# Patient Record
Sex: Female | Born: 1980 | Race: Black or African American | Hispanic: No | Marital: Single | Smoking: Never smoker
Health system: Southern US, Community
[De-identification: ages and names within clinical notes are randomized; demographics above are authoritative.]

## PROBLEM LIST (undated history)

## (undated) DIAGNOSIS — Z8041 Family history of malignant neoplasm of ovary: Secondary | ICD-10-CM

## (undated) DIAGNOSIS — Z801 Family history of malignant neoplasm of trachea, bronchus and lung: Secondary | ICD-10-CM

## (undated) DIAGNOSIS — Z8042 Family history of malignant neoplasm of prostate: Secondary | ICD-10-CM

## (undated) DIAGNOSIS — Z803 Family history of malignant neoplasm of breast: Secondary | ICD-10-CM

## (undated) HISTORY — PX: WISDOM TOOTH EXTRACTION: SHX21

## (undated) HISTORY — PX: LYMPH GLAND EXCISION: SHX13

## (undated) HISTORY — DX: Family history of malignant neoplasm of breast: Z80.3

## (undated) HISTORY — DX: Family history of malignant neoplasm of prostate: Z80.42

## (undated) HISTORY — DX: Family history of malignant neoplasm of ovary: Z80.41

## (undated) HISTORY — DX: Family history of malignant neoplasm of trachea, bronchus and lung: Z80.1

---
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
## 2000-10-09 ENCOUNTER — Other Ambulatory Visit: Admission: RE | Admit: 2000-10-09 | Discharge: 2000-10-09 | Payer: Self-pay | Admitting: Obstetrics and Gynecology

## 2000-12-11 ENCOUNTER — Encounter (HOSPITAL_BASED_OUTPATIENT_CLINIC_OR_DEPARTMENT_OTHER): Payer: Self-pay | Admitting: General Surgery

## 2000-12-13 ENCOUNTER — Ambulatory Visit (HOSPITAL_COMMUNITY): Admission: RE | Admit: 2000-12-13 | Discharge: 2000-12-13 | Payer: Self-pay | Admitting: General Surgery

## 2000-12-13 ENCOUNTER — Encounter (INDEPENDENT_AMBULATORY_CARE_PROVIDER_SITE_OTHER): Payer: Self-pay | Admitting: Specialist

## 2001-09-30 ENCOUNTER — Other Ambulatory Visit: Admission: RE | Admit: 2001-09-30 | Discharge: 2001-09-30 | Payer: Self-pay | Admitting: Obstetrics and Gynecology

## 2001-12-23 ENCOUNTER — Other Ambulatory Visit: Admission: RE | Admit: 2001-12-23 | Discharge: 2001-12-23 | Payer: Self-pay | Admitting: Obstetrics and Gynecology

## 2002-04-09 ENCOUNTER — Other Ambulatory Visit: Admission: RE | Admit: 2002-04-09 | Discharge: 2002-04-09 | Payer: Self-pay | Admitting: Obstetrics and Gynecology

## 2002-09-03 ENCOUNTER — Other Ambulatory Visit: Admission: RE | Admit: 2002-09-03 | Discharge: 2002-09-03 | Payer: Self-pay | Admitting: *Deleted

## 2003-02-22 ENCOUNTER — Other Ambulatory Visit: Admission: RE | Admit: 2003-02-22 | Discharge: 2003-02-22 | Payer: Self-pay | Admitting: Obstetrics and Gynecology

## 2003-08-18 ENCOUNTER — Other Ambulatory Visit: Admission: RE | Admit: 2003-08-18 | Discharge: 2003-08-18 | Payer: Self-pay | Admitting: Obstetrics and Gynecology

## 2004-05-03 ENCOUNTER — Other Ambulatory Visit: Admission: RE | Admit: 2004-05-03 | Discharge: 2004-05-03 | Payer: Self-pay | Admitting: Obstetrics and Gynecology

## 2004-08-23 ENCOUNTER — Other Ambulatory Visit: Admission: RE | Admit: 2004-08-23 | Discharge: 2004-08-23 | Payer: Self-pay | Admitting: Obstetrics and Gynecology

## 2005-03-01 ENCOUNTER — Other Ambulatory Visit: Admission: RE | Admit: 2005-03-01 | Discharge: 2005-03-01 | Payer: Self-pay | Admitting: Obstetrics and Gynecology

## 2005-09-07 ENCOUNTER — Other Ambulatory Visit: Admission: RE | Admit: 2005-09-07 | Discharge: 2005-09-07 | Payer: Self-pay | Admitting: Obstetrics and Gynecology

## 2006-01-15 ENCOUNTER — Other Ambulatory Visit: Admission: RE | Admit: 2006-01-15 | Discharge: 2006-01-15 | Payer: Self-pay | Admitting: Obstetrics and Gynecology

## 2008-07-07 ENCOUNTER — Emergency Department (HOSPITAL_COMMUNITY): Admission: EM | Admit: 2008-07-07 | Discharge: 2008-07-07 | Payer: Self-pay | Admitting: Emergency Medicine

## 2008-11-23 ENCOUNTER — Inpatient Hospital Stay (HOSPITAL_COMMUNITY): Admission: AD | Admit: 2008-11-23 | Discharge: 2008-11-23 | Payer: Self-pay | Admitting: Obstetrics and Gynecology

## 2009-03-02 ENCOUNTER — Observation Stay (HOSPITAL_COMMUNITY): Admission: AD | Admit: 2009-03-02 | Discharge: 2009-03-03 | Payer: Self-pay | Admitting: Obstetrics and Gynecology

## 2009-03-27 ENCOUNTER — Ambulatory Visit (HOSPITAL_COMMUNITY): Admission: AD | Admit: 2009-03-27 | Discharge: 2009-03-27 | Payer: Self-pay | Admitting: Obstetrics and Gynecology

## 2009-05-10 ENCOUNTER — Emergency Department (HOSPITAL_COMMUNITY): Admission: EM | Admit: 2009-05-10 | Discharge: 2009-05-10 | Payer: Self-pay | Admitting: Family Medicine

## 2009-06-03 ENCOUNTER — Inpatient Hospital Stay (HOSPITAL_COMMUNITY): Admission: RE | Admit: 2009-06-03 | Discharge: 2009-06-05 | Payer: Self-pay | Admitting: Obstetrics and Gynecology

## 2009-07-26 ENCOUNTER — Emergency Department (HOSPITAL_COMMUNITY): Admission: EM | Admit: 2009-07-26 | Discharge: 2009-07-26 | Payer: Self-pay | Admitting: Emergency Medicine

## 2009-12-01 ENCOUNTER — Emergency Department (HOSPITAL_COMMUNITY): Admission: EM | Admit: 2009-12-01 | Discharge: 2009-12-02 | Payer: Self-pay | Admitting: Emergency Medicine

## 2010-02-03 ENCOUNTER — Emergency Department (HOSPITAL_COMMUNITY): Admission: EM | Admit: 2010-02-03 | Discharge: 2010-02-03 | Payer: Self-pay | Admitting: Family Medicine

## 2010-09-11 ENCOUNTER — Inpatient Hospital Stay (INDEPENDENT_AMBULATORY_CARE_PROVIDER_SITE_OTHER)
Admission: RE | Admit: 2010-09-11 | Discharge: 2010-09-11 | Disposition: A | Discharge status: Home / Self Care | Payer: Self-pay | Source: Ambulatory Visit | Attending: Family Medicine | Admitting: Family Medicine

## 2010-09-11 DIAGNOSIS — S8000XA Contusion of unspecified knee, initial encounter: Secondary | ICD-10-CM

## 2010-10-24 LAB — COMPREHENSIVE METABOLIC PANEL
ALT: 12 U/L (ref 0–35)
AST: 15 U/L (ref 0–37)
Albumin: 4 g/dL (ref 3.5–5.2)
Calcium: 9.4 mg/dL (ref 8.4–10.5)
GFR calc Af Amer: >60 mL/min (ref >60)
Sodium: 139 mEq/L (ref 135–145)
Total Protein: 7.6 g/dL (ref 6.0–8.3)

## 2010-10-24 LAB — RAPID URINE DRUG SCREEN, HOSP PERFORMED
Benzodiazepines: NOT DETECTED
Cocaine: NOT DETECTED
Tetrahydrocannabinol: NOT DETECTED

## 2010-10-24 LAB — DIFFERENTIAL
Eosinophils Absolute: 0.1 10*3/uL (ref 0.0–0.7)
Eosinophils Relative: 1 % (ref 0–5)
Lymphs Abs: 1.6 10*3/uL (ref 0.7–4.0)
Monocytes Absolute: 0.7 10*3/uL (ref 0.1–1.0)
Monocytes Relative: 7 % (ref 3–12)

## 2010-10-24 LAB — CBC
MCHC: 33.3 g/dL (ref 30.0–36.0)
RDW: 13 % (ref 11.5–15.5)

## 2010-10-24 LAB — URINALYSIS, ROUTINE W REFLEX MICROSCOPIC
Bilirubin Urine: NEGATIVE
Ketones, ur: NEGATIVE mg/dL
Nitrite: NEGATIVE
Protein, ur: NEGATIVE mg/dL
pH: 7.5 (ref 5.0–8.0)

## 2010-10-24 LAB — POCT PREGNANCY, URINE: Preg Test, Ur: NEGATIVE

## 2010-10-24 LAB — ETHANOL: Alcohol, Ethyl (B): <5 mg/dL (ref 0–10)

## 2010-11-02 ENCOUNTER — Inpatient Hospital Stay (INDEPENDENT_AMBULATORY_CARE_PROVIDER_SITE_OTHER)
Admission: RE | Admit: 2010-11-02 | Discharge: 2010-11-02 | Disposition: A | Discharge status: Home / Self Care | Payer: Self-pay | Source: Ambulatory Visit | Attending: Emergency Medicine | Admitting: Emergency Medicine

## 2010-11-02 DIAGNOSIS — N390 Urinary tract infection, site not specified: Secondary | ICD-10-CM

## 2010-11-02 DIAGNOSIS — J029 Acute pharyngitis, unspecified: Secondary | ICD-10-CM

## 2010-11-02 LAB — POCT URINALYSIS DIP (DEVICE)
Hgb urine dipstick: NEGATIVE
Nitrite: POSITIVE — AB
Urobilinogen, UA: 1 mg/dL (ref 0.0–1.0)
pH: 5.5 (ref 5.0–8.0)

## 2010-11-09 LAB — CBC
HCT: 34.8 % — ABNORMAL LOW (ref 36.0–46.0)
Hemoglobin: 10.1 g/dL — ABNORMAL LOW (ref 12.0–15.0)
MCHC: 33.5 g/dL (ref 30.0–36.0)
MCV: 86.6 fL (ref 78.0–100.0)
Platelets: 111 10*3/uL — ABNORMAL LOW (ref 150–400)
RDW: 14.3 % (ref 11.5–15.5)
WBC: 15.4 10*3/uL — ABNORMAL HIGH (ref 4.0–10.5)

## 2010-11-09 LAB — RH IMMUNE GLOB WKUP(>/=20WKS)(NOT WOMEN'S HOSP): Fetal Screen: NEGATIVE

## 2010-11-09 LAB — GLUCOSE, CAPILLARY: Glucose-Capillary: 64 mg/dL — ABNORMAL LOW (ref 70–99)

## 2010-11-11 LAB — CBC
HCT: 32.5 % — ABNORMAL LOW (ref 36.0–46.0)
MCHC: 33.7 g/dL (ref 30.0–36.0)
MCV: 90.9 fL (ref 78.0–100.0)
Platelets: 126 10*3/uL — ABNORMAL LOW (ref 150–400)
RDW: 12.9 % (ref 11.5–15.5)

## 2010-11-11 LAB — RH IMMUNE GLOBULIN WORKUP (NOT WOMEN'S HOSP)
ABO/RH(D): B NEG
Antibody Screen: NEGATIVE

## 2010-11-12 LAB — COMPREHENSIVE METABOLIC PANEL
ALT: 13 U/L (ref 0–35)
Albumin: 2.6 g/dL — ABNORMAL LOW (ref 3.5–5.2)
Alkaline Phosphatase: 50 U/L (ref 39–117)
BUN: 4 mg/dL — ABNORMAL LOW (ref 6–23)
Chloride: 104 mEq/L (ref 96–112)
Glucose, Bld: 93 mg/dL (ref 70–99)
Potassium: 4.2 mEq/L (ref 3.5–5.1)
Sodium: 134 mEq/L — ABNORMAL LOW (ref 135–145)
Total Bilirubin: 0.6 mg/dL (ref 0.3–1.2)

## 2010-11-12 LAB — CBC
HCT: 30.2 % — ABNORMAL LOW (ref 36.0–46.0)
Hemoglobin: 10.4 g/dL — ABNORMAL LOW (ref 12.0–15.0)
Platelets: 113 10*3/uL — ABNORMAL LOW (ref 150–400)
Platelets: 122 10*3/uL — ABNORMAL LOW (ref 150–400)
RDW: 12.8 % (ref 11.5–15.5)
WBC: 12.6 10*3/uL — ABNORMAL HIGH (ref 4.0–10.5)

## 2010-11-12 LAB — RH IMMUNE GLOBULIN WORKUP (NOT WOMEN'S HOSP): ABO/RH(D): B NEG

## 2010-11-12 LAB — KLEIHAUER-BETKE STAIN: Quantitation Fetal Hemoglobin: 0 mL

## 2010-11-12 LAB — URIC ACID: Uric Acid, Serum: 4.3 mg/dL (ref 2.4–7.0)

## 2010-11-15 LAB — RH IMMUNE GLOBULIN WORKUP (NOT WOMEN'S HOSP)
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
## 2010-12-22 NOTE — Op Note (Signed)
[place]. [facility]  Patient:    Tiffany Singleton, DEAKINS                        MRN: [id number] Proc. Date: 12/13/00 Attending:  Luisa Hart L. Lurene Shadow, M.D.                           Operative Report  PREOPERATIVE DIAGNOSIS:  Right-sided axillary ectopic breast tissue.  POSTOPERATIVE DIAGNOSIS:  Right-sided axillary ectopic breast tissue.  OPERATION PERFORMED:  Excision of right axillary mass (ectopic breast tissue).  SURGEON:  Mardene Celeste. Lurene Shadow, M.D.  ASSISTANT:  Nurse.  ANESTHESIA:  General.  INDICATIONS FOR PROCEDURE:  The patient is a 30 year old female with an enlarging mass in the right axilla which causes some pain, particularly during the course of menses and she is brought to the operating room now for excision of this mass.  DESCRIPTION OF PROCEDURE:  Following the induction of anesthesia, the patient was positioned supinely and the right axilla prepped and draped to be included in a sterile operative field.  I palpated the mass, infiltrated around it with 1% Xylocaine with epinephrine.  I made a transverse axillary incision and deepened this through the skin and subcutaneous tissues raising flaps inferiorly and superiorly.  The mass was dissected free from the underlying skin carrying it down to the chest wall.  The entire mass was removed in its entirety and forwarded for pathologic evaluation.  Hemostasis was assured with electrocautery.  Sponge, instrument and sharp counts were verified. Subcutaneous tissues closed with interrupted 3-0 Vicryl sutures.  Skin closed with 5-0 Monocryl and reinforced with Steri-Strips.  Sterile dressings applied.  Anesthetic reversed.  Patient removed from the operating room to the recovery room in stable condition having tolerated the procedure well. DD:  12/13/00 TD:  12/13/00 Job: [id number]

## 2011-05-15 ENCOUNTER — Emergency Department (HOSPITAL_BASED_OUTPATIENT_CLINIC_OR_DEPARTMENT_OTHER)
Admission: EM | Admit: 2011-05-15 | Discharge: 2011-05-15 | Disposition: A | Discharge status: Home / Self Care | Payer: Self-pay | Attending: Emergency Medicine | Admitting: Emergency Medicine

## 2011-05-15 ENCOUNTER — Encounter: Payer: Self-pay | Admitting: Family Medicine

## 2011-05-15 DIAGNOSIS — H9209 Otalgia, unspecified ear: Secondary | ICD-10-CM | POA: Insufficient documentation

## 2011-05-15 DIAGNOSIS — H6092 Unspecified otitis externa, left ear: Secondary | ICD-10-CM

## 2011-05-15 DIAGNOSIS — H60399 Other infective otitis externa, unspecified ear: Secondary | ICD-10-CM | POA: Insufficient documentation

## 2011-05-15 LAB — RAPID STREP SCREEN (MED CTR MEBANE ONLY): Streptococcus, Group A Screen (Direct): NEGATIVE

## 2011-05-15 MED ORDER — NEOMYCIN-POLYMYXIN-HC 3.5-10000-1 OT SUSP: 4.0000 [drp] | Freq: Three times a day (TID) | OTIC | Status: AC

## 2011-05-15 NOTE — ED Notes (Signed)
MD at bedside. 

## 2011-05-15 NOTE — ED Provider Notes (Addendum)
History     CSN: [id number] Arrival date & time: 05/15/2011  8:35 AM  Chief Complaint  Patient presents with  . Otalgia    (Consider location/radiation/quality/duration/timing/severity/associated sxs/prior treatment) Patient is a 30 y.o. female presenting with ear pain. The history is provided by the patient.  Otalgia This is a new problem. The current episode started more than 2 days ago. There is pain in the left ear. The problem occurs constantly. The problem has not changed since onset.There has been no fever. The pain is mild. Associated symptoms include ear discharge, rhinorrhea, sore throat, cough and rash. Pertinent negatives include no headaches, no hearing loss, no diarrhea, no vomiting and no neck pain. Her past medical history does not include chronic ear infection or hearing loss.   Patient states she tried using debrox solution and hydrogen peroxide in her ear. This seemed to exacerbate her symptoms. History reviewed. No pertinent past medical history.  Past Surgical History  Procedure Date  . Lymph gland excision   . Wisdom tooth extraction     No family history on file.  History  Substance Use Topics  . Smoking status: Never Smoker   . Smokeless tobacco: Not on file  . Alcohol Use: No    OB History    Grav Para Term Preterm Abortions TAB SAB Ect Mult Living                  Review of Systems  HENT: Positive for ear pain, sore throat, rhinorrhea and ear discharge. Negative for hearing loss and neck pain.   Respiratory: Positive for cough.   Gastrointestinal: Negative for vomiting and diarrhea.  Skin: Positive for rash.  Neurological: Negative for headaches.  All other systems reviewed and are negative.    Allergies  Hydrocodone  Home Medications   Current Outpatient Rx  Name Route Sig Dispense Refill  . ACETAMINOPHEN 325 MG PO TABS Oral Take 650 mg by mouth every 6 (six) hours as needed.        BP 117/68  Pulse 63  Temp(Src) 98.5 F (36.9  C) (Oral)  Resp 16  Ht 5\' 2"  (1.575 m)  Wt 155 lb (70.308 kg)  BMI 28.35 kg/m2  SpO2 100%  Physical Exam  Nursing note and vitals reviewed. Constitutional: She appears well-developed and well-nourished. No distress.  HENT:  Head: Normocephalic and atraumatic.  Right Ear: External ear normal.  Left Ear: External ear normal.  Mouth/Throat: Oropharynx is clear and moist. No oropharyngeal exudate.       Tenderness to palpation with manipulation of left external ear, small amount yellowish crusting debris within the canal, no significant erythema or purulent drainage  Eyes: Conjunctivae are normal. Right eye exhibits no discharge. Left eye exhibits no discharge. No scleral icterus.  Neck: Neck supple. No tracheal deviation present.  Cardiovascular: Normal rate and normal heart sounds.   Pulmonary/Chest: Effort normal. No stridor. No respiratory distress. She has no wheezes. She has no rales.  Musculoskeletal: She exhibits no edema.  Neurological: She is alert. Cranial nerve deficit: no gross deficits.  Skin: Skin is warm and dry. No rash noted.  Psychiatric: She has a normal mood and affect.    ED Course  Procedures (including critical care time)   Labs Reviewed  RAPID STREP SCREEN  negative No results found.   1. Otitis externa of left ear       MDM  Patient with very mild symptoms and findings to suggest a external otitis. Negative strep screen in  the emergency department.  Patient will be given a prescription for Cortisporin ear drops.  She is counseled to discontinue straight hydrogen peroxide use in her ears I think that may be contributing to some of her irritation.       Celene Kras, MD 05/15/11 1610  Celene Kras, MD 05/15/11 [phone]

## 2011-05-15 NOTE — ED Notes (Signed)
Pt c/o bilateral ear pain and sore throat. Pt sts she used OTC "ear wax removal" and "it started burning so I was concerned".

## 2012-05-08 ENCOUNTER — Encounter: Payer: Self-pay | Admitting: Obstetrics and Gynecology

## 2016-07-11 ENCOUNTER — Ambulatory Visit (INDEPENDENT_AMBULATORY_CARE_PROVIDER_SITE_OTHER): Payer: Self-pay | Admitting: Family Medicine

## 2016-07-11 ENCOUNTER — Telehealth: Payer: Self-pay | Admitting: Family Medicine

## 2016-07-11 VITALS — BP 112/74 | HR 69 | Temp 98.7°F | Resp 17 | Ht 62.0 in | Wt 138.0 lb

## 2016-07-11 DIAGNOSIS — Z113 Encounter for screening for infections with a predominantly sexual mode of transmission: Secondary | ICD-10-CM

## 2016-07-11 DIAGNOSIS — N760 Acute vaginitis: Secondary | ICD-10-CM

## 2016-07-11 DIAGNOSIS — R350 Frequency of micturition: Secondary | ICD-10-CM

## 2016-07-11 LAB — POCT WET + KOH PREP
Trich by wet prep: ABSENT
YEAST BY KOH: ABSENT
YEAST BY WET PREP: ABSENT

## 2016-07-11 LAB — POCT URINALYSIS DIP (MANUAL ENTRY)
Bilirubin, UA: NEGATIVE
Blood, UA: NEGATIVE
Glucose, UA: NEGATIVE
Ketones, POC UA: NEGATIVE
LEUKOCYTES UA: NEGATIVE
Nitrite, UA: NEGATIVE
PH UA: 6.5
PROTEIN UA: NEGATIVE
Spec Grav, UA: 1.015
UROBILINOGEN UA: 0.2

## 2016-07-11 LAB — POC MICROSCOPIC URINALYSIS (UMFC): MUCUS RE: ABSENT

## 2016-07-11 NOTE — Progress Notes (Signed)
Tiffany Singleton is a 35 y.o. female who presents to [facility] today with complaints concerning for UTI:   1.  Concern for UTI: Tiffany Sicklesngel C Pala is a 35 y.o. female who complains of urinary frequency, urgency and dysuria x several days.  Patient states she's having alternating BV with UTI symptoms for the past month.  Started with BV indicated by odor.  PCP tested her for the same and was positive.  Began having urinary frequency and some dysuria about 2 weeks later.  Took Azo and after several days symptoms improved.  About 1-2 weeks later her BV symptoms returned.  PCP called in Flagyl for her and this resolved.    However, for past several days she's had persistent vaginal odor.  As well as ongoing urinary frequency and dysuria and hesitancy.  Has taken Azo on and off with inconsistent resolution of symptoms.    New sexual partner for past several months.  No abdominal pain, no history of STDs.  Would like to be tested today.   She denies any flank or back pain, fever, chills, vomiting, or abnormal vaginal discharge/itching/bleeding.  Does endorse some suprapubic pressure.  No history of incontinence.    The following portions of the patient's history were reviewed and updated as appropriate: allergies, current medications, past medical history, family and social history, and problem list.  Patient is a nonsmoker.    No past medical history on file. Past Surgical History:  Procedure Laterality Date  . LYMPH GLAND EXCISION    . WISDOM TOOTH EXTRACTION      Medications reviewed. Current Outpatient Prescriptions  Medication Sig Dispense Refill  . acetaminophen (TYLENOL) 325 MG tablet Take 650 mg by mouth every 6 (six) hours as needed.       No current facility-administered medications for this visit.     ROS as above otherwise neg.       Objective:   Physical Exam BP 112/74 (BP Location: Left Arm, Patient Position: Sitting, Cuff Size: Large)   Pulse 69   Temp 98.7 F (37.1 C) (Oral)    Resp 17   Ht 5\' 2"  (1.575 m)   Wt 138 lb (62.6 kg)   SpO2 100%   BMI 25.24 kg/m  Gen:  Alert, cooperative patient who appears stated age in no acute distress.  Vital signs reviewed. Mouth: MMM Cardiac:  Regular rate and rhythm without murmur auscultated.  Good S1/S2. Pulm:  Clear to auscultation bilaterally with good air movement.  No wheezes or rales noted.   Abdomen:  Soft/ND/NT.  No CVA tenderness bilaterally  GYN:  External genitalia within normal limits.  Vaginal mucosa pink, moist, normal rugae.  Nonfriable cervix without lesions, thick white discharge without bleeding noted on speculum exam.     Results for orders placed or performed in visit on 07/11/16  POCT Wet + KOH Prep  Result Value Ref Range   Yeast by KOH Absent Absent   Yeast by wet prep Absent Absent   WBC by wet prep Moderate (A) Few   Clue Cells Wet Prep HPF POC Few (A) None   Trich by wet prep Absent Absent   Bacteria Wet Prep HPF POC Moderate (A) Few   Epithelial Cells By [employer]et Pref ([facility]) Moderate (A) None, Few, Too numerous to count   RBC,UR,HPF,POC None None RBC/hpf  POCT urinalysis dipstick  Result Value Ref Range   Color, UA yellow yellow   Clarity, UA cloudy (A) clear   Glucose, UA negative negative  Bilirubin, UA negative negative   Ketones, POC UA negative negative   Spec Grav, UA 1.015    Blood, UA negative negative   pH, UA 6.5    Protein Ur, POC negative negative   Urobilinogen, UA 0.2    Nitrite, UA Negative Negative   Leukocytes, UA Negative Negative  POCT Microscopic Urinalysis ([facility])  Result Value Ref Range   WBC,UR,HPF,POC Moderate (A) None WBC/hpf   RBC,UR,HPF,POC None None RBC/hpf   Bacteria Moderate (A) None, Too numerous to count   Mucus Absent Absent   Epithelial Cells, UR Per Microscopy Moderate (A) None, Too numerous to count cells/hpf     Imp/Plan: 1.  UTI: Treatment with Keflex x 7 days. Treat based on symptoms and U/A. Call or return to clinic prn if these symptoms  worsen or fail to improve as anticipated, or if she begins to have any back pain, fevers, or chills.  2.  BV: - Patient waited about 30 minutes and still didn't have test results.   - I told her I would call -- however, I was unable to reach her when I called. - Will treat with Flagyl x 7 days.   - We do NOT have a pharmacy for her.  - Will forward to clinical team to call patient and let know results and see where we can send her scripts.

## 2016-07-11 NOTE — Patient Instructions (Addendum)
  It was good to meet you today.    I'll be giving you a call with your results in a just a little bit.   The blood tests will take about 24 hours to come back.   IF you received an x-ray today, you will receive an invoice from [facility]. Please contact [facility] at [phone] with questions or concerns regarding your invoice.   IF you received labwork today, you will receive an invoice from [facility]. Please contact [facility] at [phone] with questions or concerns regarding your invoice.   Our billing staff will not be able to assist you with questions regarding bills from these companies.  You will be contacted with the lab results as soon as they are available. The fastest way to get your results is to activate your My Chart account. Instructions are located on the last page of this paperwork. If you have not heard from us regarding the results in 2 weeks, please contact this office.

## 2016-07-11 NOTE — Telephone Encounter (Signed)
Tried to call patient to ask for pharmacy (none on file) and to relay test results.  However, she didn't answer.  Left very vague message and asked her to return call.    Please call patient and let her know she has BV and UTI.  We can send in keflex and flagyl to treat once we know her pharmacy.

## 2016-07-12 LAB — RPR: RPR Ser Ql: NONREACTIVE

## 2016-07-12 LAB — GC/CHLAMYDIA PROBE AMP
Chlamydia trachomatis, NAA: NEGATIVE
NEISSERIA GONORRHOEAE BY PCR: NEGATIVE

## 2016-07-12 LAB — HIV ANTIBODY (ROUTINE TESTING W REFLEX): HIV SCREEN 4TH GENERATION: NONREACTIVE

## 2016-07-12 NOTE — Telephone Encounter (Signed)
Pt calling stating thaT dR WALDEN CALLED HER ABOUT LABS WHICH I GAVE HER RESULTS AND PT WANTS US TO SEND RX TO [facility] [address] PLEASE RESPOND

## 2016-07-12 NOTE — Telephone Encounter (Signed)
Patient is calling because the prescriptions for keflex and flagyl were not sent to the pharmacy. Patient was told that she would receive them today by 2 pm. Please advise!   [facility] [address]

## 2016-07-13 MED ORDER — CEPHALEXIN 500 MG PO CAPS: 500.0000 mg | ORAL_CAPSULE | Freq: Three times a day (TID) | ORAL | 0 refills | Status: DC

## 2016-07-13 MED ORDER — METRONIDAZOLE 500 MG PO TABS: 500.0000 mg | ORAL_TABLET | Freq: Two times a day (BID) | ORAL | 0 refills | Status: DC

## 2016-07-13 NOTE — Telephone Encounter (Signed)
Notified pt on VM that both Rxs were sent and advised to not drink any alcohol with Flagyl.

## 2016-07-13 NOTE — Telephone Encounter (Signed)
I will forward this to Dr Gwendolyn GrantWalden in case he is monitoring his messages while out of the office, but also send to pool for "[employer]" for someone else to address if not since he will not be back in office for several days.  Copied following from Dr Tyson AliasWalden's Plan in 12/6 OV notes:   Imp/Plan: 1.  UTI: Treatment with Keflex x 7 days. Treat based on symptoms and U/A. Call or return to clinic prn if these symptoms worsen or fail to improve as anticipated, or if she begins to have any back pain, fevers, or chills.  2.  BV: - Patient waited about 30 minutes and still didn't have test results.   - I told her I would call -- however, I was unable to reach her when I called. - Will treat with Flagyl x 7 days.   - We do NOT have a pharmacy for her.  - Will forward to clinical team to call patient and let know results and see where we can send her scripts.

## 2016-07-13 NOTE — Telephone Encounter (Signed)
Prescriptions for Keflex and Flagyl sent to [facility] [address]

## 2016-08-14 ENCOUNTER — Other Ambulatory Visit: Payer: Self-pay | Admitting: Family Medicine

## 2017-05-16 ENCOUNTER — Ambulatory Visit (INDEPENDENT_AMBULATORY_CARE_PROVIDER_SITE_OTHER): Payer: Self-pay | Admitting: Family Medicine

## 2017-05-16 ENCOUNTER — Encounter: Payer: Self-pay | Admitting: Family Medicine

## 2017-05-16 VITALS — BP 112/80 | HR 81 | Temp 98.6°F | Resp 18 | Ht 62.0 in | Wt 147.4 lb

## 2017-05-16 DIAGNOSIS — R300 Dysuria: Secondary | ICD-10-CM

## 2017-05-16 DIAGNOSIS — N3000 Acute cystitis without hematuria: Secondary | ICD-10-CM

## 2017-05-16 LAB — POCT URINALYSIS DIP (MANUAL ENTRY)
BILIRUBIN UA: NEGATIVE
GLUCOSE UA: NEGATIVE mg/dL
Ketones, POC UA: NEGATIVE mg/dL
NITRITE UA: POSITIVE — AB
PH UA: 6 (ref 5.0–8.0)
Protein Ur, POC: NEGATIVE mg/dL
RBC UA: NEGATIVE
Spec Grav, UA: 1.02 (ref 1.010–1.025)
Urobilinogen, UA: 0.2 E.U./dL

## 2017-05-16 LAB — POC MICROSCOPIC URINALYSIS (UMFC): Mucus: ABSENT

## 2017-05-16 MED ORDER — CEPHALEXIN 500 MG PO CAPS: 500.0000 mg | ORAL_CAPSULE | Freq: Three times a day (TID) | ORAL | 0 refills | Status: DC

## 2017-05-16 NOTE — Progress Notes (Signed)
Subjective:    Patient ID: Tiffany Singleton, female    DOB: [birth year], 36 y.o.   MRN: [id number] Chief Complaint  Patient presents with  . Back Pain    x1 week, lower left side, pt states she can go to the bathroom but has difficulty getting the urine to come out and patient reports a smell.    HPI   [name] is a delightful 36 yo woman who is here with back pain, urinary hesitancy, and a urinary odor.   History reviewed. No pertinent past medical history. Past Surgical History:  Procedure Laterality Date  . LYMPH GLAND EXCISION    . WISDOM TOOTH EXTRACTION     Current Outpatient Prescriptions on File Prior to Visit  Medication Sig Dispense Refill  . acetaminophen (TYLENOL) 325 MG tablet Take 650 mg by mouth every 6 (six) hours as needed.       No current facility-administered medications on file prior to visit.    Allergies  Allergen Reactions  . Hydrocodone    History reviewed. No pertinent family history. Social History   Social History  . Marital status: Single    Spouse name: N/A  . Number of children: N/A  . Years of education: N/A   Social History Main Topics  . Smoking status: Never Smoker  . Smokeless tobacco: Never Used  . Alcohol use No  . Drug use: No  . Sexual activity: Not Asked   Other Topics Concern  . None   Social History Narrative  . None   Depression screen [facility] 2/9 05/16/2017 07/11/2016  Decreased Interest 0 0  Down, Depressed, Hopeless 0 0  PHQ - 2 Score 0 0     Review of Systems See hpi    Objective:   Physical Exam  Constitutional: She is oriented to person, place, and time. She appears well-developed and well-nourished. No distress.  HENT:  Head: Normocephalic and atraumatic.  Cardiovascular: Normal rate, regular rhythm, normal heart sounds and intact distal pulses.   Pulmonary/Chest: Effort normal and breath sounds normal.  Abdominal: Soft. Bowel sounds are normal. She exhibits no distension. There is no  hepatosplenomegaly. There is no tenderness. There is CVA tenderness (Left>Rt). There is no rebound and no guarding.  Neurological: She is alert and oriented to person, place, and time.  Skin: Skin is warm and dry. She is not diaphoretic.  Psychiatric: She has a normal mood and affect. Her behavior is normal.      BP 112/80 (BP Location: Left Arm, Patient Position: Sitting, Cuff Size: Normal)   Pulse 81   Temp 98.6 F (37 C) (Oral)   Resp 18   Ht  (1.575 m)   Wt 147 lb 6.4 oz (66.9 kg)   SpO2 99%   BMI 26.96 kg/m   Results for orders placed or performed in visit on 05/16/17  POCT urinalysis dipstick  Result Value Ref Range   Color, UA yellow yellow   Clarity, UA clear clear   Glucose, UA negative negative mg/dL   Bilirubin, UA negative negative   Ketones, POC UA negative negative mg/dL   Spec Grav, UA 4.098 1.191 - 1.025   Blood, UA negative negative   pH, UA 6.0 5.0 - 8.0   Protein Ur, POC negative negative mg/dL   Urobilinogen, UA 0.2 0.2 or 1.0 E.U./dL   Nitrite, UA Positive (A) Negative   Leukocytes, UA Large (3+) (A) Negative  POCT Microscopic Urinalysis ([facility])  Result Value Ref Range  WBC,UR,HPF,POC Too numerous to count  (A) None WBC/hpf   RBC,UR,HPF,POC None None RBC/hpf   Bacteria Too numerous to count  None, Too numerous to count   Mucus Absent Absent   Epithelial Cells, UR Per Microscopy None None, Too numerous to count cells/hpf    Assessment & Plan:   1. Dysuria   2. Acute cystitis without hematuria     Orders Placed This Encounter  Procedures  . Urine Culture  . POCT urinalysis dipstick  . POCT Microscopic Urinalysis ([facility])    Meds ordered this encounter  Medications  . cephALEXin (KEFLEX) 500 MG capsule    Sig: Take 1 capsule (500 mg total) by mouth 3 (three) times daily.    Dispense:  21 capsule    Refill:  0    Norberto Sorenson, M.D.  Primary Care at [facility] [address] [place], [place] [address] [phone] phone [phone] fax  05/18/17 4:59 AM

## 2017-05-16 NOTE — Patient Instructions (Addendum)
   IF you received an x-ray today, you will receive an invoice from [facility]. Please contact [facility] at [phone] with questions or concerns regarding your invoice.   IF you received labwork today, you will receive an invoice from [facility]. Please contact [facility] at [phone] with questions or concerns regarding your invoice.   Our billing staff will not be able to assist you with questions regarding bills from these companies.  You will be contacted with the lab results as soon as they are available. The fastest way to get your results is to activate your My Chart account. Instructions are located on the last page of this paperwork. If you have not heard from us regarding the results in 2 weeks, please contact this office.     Urinary Tract Infection, Adult A urinary tract infection (UTI) is an infection of any part of the urinary tract, which includes the kidneys, ureters, bladder, and urethra. These organs make, store, and get rid of urine in the body. UTI can be a bladder infection (cystitis) or kidney infection (pyelonephritis). What are the causes? This infection may be caused by fungi, viruses, or bacteria. Bacteria are the most common cause of UTIs. This condition can also be caused by repeated incomplete emptying of the bladder during urination. What increases the risk? This condition is more likely to develop if:  You ignore your need to urinate or hold urine for long periods of time.  You do not empty your bladder completely during urination.  You wipe back to front after urinating or having a bowel movement, if you are female.  You are uncircumcised, if you are female.  You are constipated.  You have a urinary catheter that stays in place (indwelling).  You have a weak defense (immune) system.  You have a medical condition that affects your bowels, kidneys, or bladder.  You have diabetes.  You take antibiotic medicines frequently or  for long periods of time, and the antibiotics no longer work well against certain types of infections (antibiotic resistance).  You take medicines that irritate your urinary tract.  You are exposed to chemicals that irritate your urinary tract.  You are female. What are the signs or symptoms? Symptoms of this condition include:  Fever.  Frequent urination or passing small amounts of urine frequently.  Needing to urinate urgently.  Pain or burning with urination.  Urine that smells bad or unusual.  Cloudy urine.  Pain in the lower abdomen or back.  Trouble urinating.  Blood in the urine.  Vomiting or being less hungry than normal.  Diarrhea or abdominal pain.  Vaginal discharge, if you are female. How is this diagnosed? This condition is diagnosed with a medical history and physical exam. You will also need to provide a urine sample to test your urine. Other tests may be done, including:  Blood tests.  Sexually transmitted disease (STD) testing. If you have had more than one UTI, a cystoscopy or imaging studies may be done to determine the cause of the infections. How is this treated? Treatment for this condition often includes a combination of two or more of the following:  Antibiotic medicine.  Other medicines to treat less common causes of UTI.  Over-the-counter medicines to treat pain.  Drinking enough water to stay hydrated. Follow these instructions at home:  Take over-the-counter and prescription medicines only as told by your health care provider.  If you were prescribed an antibiotic, take it as told by your health care   provider. Do not stop taking the antibiotic even if you start to feel better.  Avoid alcohol, caffeine, tea, and carbonated beverages. They can irritate your bladder.  Drink enough fluid to keep your urine clear or pale yellow.  Keep all follow-up visits as told by your health care provider. This is important.  Make sure  to:  Empty your bladder often and completely. Do not hold urine for long periods of time.  Empty your bladder before and after sex.  Wipe from front to back after a bowel movement if you are female. Use each tissue one time when you wipe. Contact a health care provider if:  You have back pain.  You have a fever.  You feel nauseous or vomit.  Your symptoms do not get better after 3 days.  Your symptoms go away and then return. Get help right away if:  You have severe back pain or lower abdominal pain.  You are vomiting and cannot keep down any medicines or water. This information is not intended to replace advice given to you by your health care provider. Make sure you discuss any questions you have with your health care provider. Document Released: 05/02/2005 Document Revised: 01/04/2016 Document Reviewed: 06/13/2015 Elsevier Interactive Patient Education  2017 Elsevier Inc.  

## 2017-05-18 ENCOUNTER — Telehealth: Payer: Self-pay | Admitting: Family Medicine

## 2017-05-18 LAB — URINE CULTURE

## 2017-05-18 NOTE — Telephone Encounter (Signed)
Pt. Called to inform Dr. Clelia Croft that her symptoms have not gone away and was seeking advice.  Best Number: [phone]

## 2017-05-21 ENCOUNTER — Ambulatory Visit (INDEPENDENT_AMBULATORY_CARE_PROVIDER_SITE_OTHER): Payer: Self-pay | Admitting: Family Medicine

## 2017-05-21 ENCOUNTER — Ambulatory Visit (INDEPENDENT_AMBULATORY_CARE_PROVIDER_SITE_OTHER): Payer: Self-pay

## 2017-05-21 ENCOUNTER — Encounter: Payer: Self-pay | Admitting: Family Medicine

## 2017-05-21 VITALS — BP 108/70 | HR 79 | Temp 98.9°F | Resp 18 | Ht 62.21 in | Wt 145.0 lb

## 2017-05-21 DIAGNOSIS — R350 Frequency of micturition: Secondary | ICD-10-CM

## 2017-05-21 DIAGNOSIS — R109 Unspecified abdominal pain: Secondary | ICD-10-CM

## 2017-05-21 LAB — POCT URINALYSIS DIP (MANUAL ENTRY)
Bilirubin, UA: NEGATIVE
Glucose, UA: NEGATIVE mg/dL
Ketones, POC UA: NEGATIVE mg/dL
Leukocytes, UA: NEGATIVE
Nitrite, UA: NEGATIVE
Protein Ur, POC: NEGATIVE mg/dL
Spec Grav, UA: 1.01 (ref 1.010–1.025)
Urobilinogen, UA: 0.2 E.U./dL
pH, UA: 7 (ref 5.0–8.0)

## 2017-05-21 MED ORDER — OXYCODONE-ACETAMINOPHEN 5-325 MG PO TABS: 1.0000 | ORAL_TABLET | Freq: Four times a day (QID) | ORAL | 0 refills | Status: DC | PRN

## 2017-05-21 MED ORDER — TAMSULOSIN HCL 0.4 MG PO CAPS: 0.4000 mg | ORAL_CAPSULE | Freq: Every day | ORAL | 0 refills | Status: DC

## 2017-05-21 NOTE — Progress Notes (Signed)
10/16/201812:58 PM  Tiffany Singleton [birth year], 36 y.o. female [id number]  Chief Complaint  Patient presents with  . Abdominal Pain    X 5 days withback pain on left upper side  . Nausea    X 4 days  . Urinary Frequency    HPI:   Patient is a 36 y.o. female  who presents today for unresolving left flank pain. Patient seen 5 days ago, dx with UTI, taking cephelaxin (appropriate per urine culture), pushing fluids, azo cranberry. However still experiencing severe intermittent colicky left flank that radiates to the front left side of abdomen, "feels like labor pain" and urinary frequency. Denies any nausea, vomiting, fever or chills, cough, SOB. Denies h/o kidney stones or complicated UTIs.   Depression screen [facility] 2/9 05/21/2017 05/16/2017 07/11/2016  Decreased Interest 0 0 0  Down, Depressed, Hopeless 0 0 0  PHQ - 2 Score 0 0 0    Allergies  Allergen Reactions  . Hydrocodone     Prior to Admission medications   Medication Sig Start Date End Date Taking? Authorizing Provider  acetaminophen (TYLENOL) 325 MG tablet Take 650 mg by mouth every 6 (six) hours as needed.     Yes [provider]  cephALEXin (KEFLEX) 500 MG capsule Take 1 capsule (500 mg total) by mouth 3 (three) times daily. 05/16/17  Yes Sherren Mocha, MD    History reviewed. No pertinent past medical history.  Past Surgical History:  Procedure Laterality Date  . LYMPH GLAND EXCISION    . WISDOM TOOTH EXTRACTION      Social History  Substance Use Topics  . Smoking status: Never Smoker  . Smokeless tobacco: Never Used  . Alcohol use Yes     Comment: occ    Family History  Problem Relation Age of Onset  . Cancer Mother   . Hypertension Father     ROS Per hpi  OBJECTIVE:  Blood pressure 108/70, pulse 79, temperature 98.9 F (37.2 C), temperature source Oral, resp. rate 18, height 5' 2.21" (1.58 m), weight 145 lb (65.8 kg), SpO2 99 %.  Physical Exam  Constitutional: She is oriented to  person, place, and time and well-developed, well-nourished, and in no distress.  HENT:  Head: Normocephalic and atraumatic.  Mouth/Throat: Oropharynx is clear and moist. No oropharyngeal exudate.  Eyes: Pupils are equal, round, and reactive to light. EOM are normal. No scleral icterus.  Neck: Neck supple.  Cardiovascular: Normal rate, regular rhythm and normal heart sounds.  Exam reveals no gallop and no friction rub.   No murmur heard. Pulmonary/Chest: Effort normal and breath sounds normal. She has no wheezes. She has no rales.  Abdominal: Soft. Bowel sounds are normal. She exhibits mass. She exhibits no distension. There is tenderness (diffuse, but more on LUQ and LLQ). There is no rebound, no guarding and no CVA tenderness.  Musculoskeletal: She exhibits no edema.  Neurological: She is alert and oriented to person, place, and time. Gait normal.  Skin: Skin is warm and dry.      Results for orders placed or performed in visit on 05/21/17 (from the past 24 hour(s))  POCT urinalysis dipstick     Status: Abnormal   Collection Time: 05/21/17 11:26 AM  Result Value Ref Range   Color, UA yellow yellow   Clarity, UA clear clear   Glucose, UA negative negative mg/dL   Bilirubin, UA negative negative   Ketones, POC UA negative negative mg/dL   Spec Grav, UA 4.098 1.191 -  1.025   Blood, UA trace-intact (A) negative   pH, UA 7.0 5.0 - 8.0   Protein Ur, POC negative negative mg/dL   Urobilinogen, UA 0.2 0.2 or 1.0 E.U./dL   Nitrite, UA Negative Negative   Leukocytes, UA Negative Negative    Dg Abd 2 Views  Result Date: 05/21/2017 CLINICAL DATA:  Colicky left flank pain and hematuria. EXAM: ABDOMEN - 2 VIEW COMPARISON:  None. FINDINGS: There is known scoliosis of the thoracolumbar spine convex toward the left. There is a rotatory component. No abnormal soft tissue calcifications are observed. The bowel gas pattern is normal. IMPRESSION: No definite calcified urinary tract stones. Prominent  thoracolumbar levocurvature. Electronically Signed   By: David  [place] M.D.   On: 05/21/2017 12:15     ASSESSMENT and PLAN 1. Acute left flank pain Per history treating empirically as kidney stones, patient has tolerated percocet in the past. Strainer provided. RTC/ER precautions given.  - DG Abd 2 Views; Future  2. Urinary frequency - POCT urinalysis dipstick  Other orders - tamsulosin (FLOMAX) 0.4 MG CAPS capsule; Take 1 capsule (0.4 mg total) by mouth daily. - oxyCODONE-acetaminophen (PERCOCET/ROXICET) 5-325 MG tablet; Take 1 tablet by mouth every 6 (six) hours as needed for severe pain.  Return if symptoms worsen or fail to improve.    Myles Lipps, MD Primary Care at [facility] [address] [place], [place] [address] Ph.  [phone] Fax [phone]

## 2017-05-21 NOTE — Patient Instructions (Addendum)
     IF you received an x-ray today, you will receive an invoice from [facility]. Please contact [facility] at [phone] with questions or concerns regarding your invoice.   IF you received labwork today, you will receive an invoice from [facility]. Please contact [facility] at [phone] with questions or concerns regarding your invoice.   Our billing staff will not be able to assist you with questions regarding bills from these companies.  You will be contacted with the lab results as soon as they are available. The fastest way to get your results is to activate your My Chart account. Instructions are located on the last page of this paperwork. If you have not heard from us regarding the results in 2 weeks, please contact this office.     Flank Pain, Adult Flank pain is pain that is located on the side of the body between the upper abdomen and the back. This area is called the flank. The pain may occur over a short period of time (acute), or it may be long-term or recurring (chronic). It may be mild or severe. Flank pain can be caused by many things, including:  Muscle soreness or injury.  Kidney stones or kidney disease.  Stress.  A disease of the spine (vertebral disk disease).  A lung infection (pneumonia).  Fluid around the lungs (pulmonary edema).  A skin rash caused by the chickenpox virus (shingles).  Tumors that affect the back of the abdomen.  Gallbladder disease.  Follow these instructions at home:  Drink enough fluid to keep your urine clear or pale yellow.  Rest as told by your health care provider.  Take over-the-counter and prescription medicines only as told by your health care provider.  Keep a journal to track what has caused your flank pain and what has made it feel better.  Keep all follow-up visits as told by your health care provider. This is important. Contact a health care provider if:  Your pain is not controlled with  medicine.  You have new symptoms.  Your pain gets worse.  You have a fever.  Your symptoms last longer than 2-3 days.  You have trouble urinating or you are urinating very frequently. Get help right away if:  You have trouble breathing or you are short of breath.  Your abdomen hurts or it is swollen or red.  You have nausea or vomiting.  You feel faint or you pass out.  You have blood in your urine. Summary  Flank pain is pain that is located on the side of the body between the upper abdomen and the back.  The pain may occur over a short period of time (acute), or it may be long-term or recurring (chronic). It may be mild or severe.  Flank pain can be caused by many things.  Contact your health care provider if your symptoms get worse or they last longer than 2-3 days. This information is not intended to replace advice given to you by your health care provider. Make sure you discuss any questions you have with your health care provider. Document Released: 09/13/2005 Document Revised: 10/05/2016 Document Reviewed: 10/05/2016 Elsevier Interactive Patient Education  2018 Elsevier Inc.  

## 2017-05-23 NOTE — Telephone Encounter (Signed)
IC pt LMOVM to return call with type of advice she was seeking. Otherwise, sign up for MyChart and we can actually respond quicker.

## 2017-12-26 ENCOUNTER — Encounter: Payer: Self-pay | Admitting: Family Medicine

## 2018-04-20 ENCOUNTER — Other Ambulatory Visit: Payer: Self-pay

## 2018-04-20 ENCOUNTER — Encounter (HOSPITAL_COMMUNITY): Payer: Self-pay | Admitting: Emergency Medicine

## 2018-04-20 ENCOUNTER — Ambulatory Visit (HOSPITAL_COMMUNITY)
Admission: EM | Admit: 2018-04-20 | Discharge: 2018-04-20 | Disposition: A | Discharge status: Home / Self Care | Payer: Self-pay | Attending: Internal Medicine | Admitting: Internal Medicine

## 2018-04-20 DIAGNOSIS — R300 Dysuria: Secondary | ICD-10-CM | POA: Insufficient documentation

## 2018-04-20 DIAGNOSIS — R350 Frequency of micturition: Secondary | ICD-10-CM | POA: Insufficient documentation

## 2018-04-20 DIAGNOSIS — R109 Unspecified abdominal pain: Secondary | ICD-10-CM | POA: Insufficient documentation

## 2018-04-20 DIAGNOSIS — J069 Acute upper respiratory infection, unspecified: Secondary | ICD-10-CM | POA: Insufficient documentation

## 2018-04-20 LAB — POCT URINALYSIS DIP (DEVICE)
Bilirubin Urine: NEGATIVE
GLUCOSE, UA: NEGATIVE mg/dL
Hgb urine dipstick: NEGATIVE
Ketones, ur: NEGATIVE mg/dL
LEUKOCYTES UA: NEGATIVE
NITRITE: POSITIVE — AB
PROTEIN: NEGATIVE mg/dL
Specific Gravity, Urine: 1.015 (ref 1.005–1.030)
UROBILINOGEN UA: 0.2 mg/dL (ref 0.0–1.0)
pH: 7 (ref 5.0–8.0)

## 2018-04-20 MED ORDER — NITROFURANTOIN MONOHYD MACRO 100 MG PO CAPS: 100.0000 mg | ORAL_CAPSULE | Freq: Two times a day (BID) | ORAL | 0 refills | Status: AC

## 2018-04-20 NOTE — ED Triage Notes (Addendum)
Pt reports left flank pain with urinary urgency, frequency and odor for over one week.  Pt is also suffering from a URI with nasal congestion, ear pain, sore throat and cough.  She has been taking Mucinex, Azo, and cranberry pills for her symptoms.  Pt also reports taking Flomax and some left over Flagyl she had.

## 2018-04-20 NOTE — Discharge Instructions (Addendum)
Recommend start Macrobid 100mg  twice a day as directed. Increase fluid intake and may continue taking Cranberry pills as directed. May also take OTC Sudafed every 8 hours as needed for congestion. May take Ibuprofen 600mg  every 8 hours as needed for pain. Recommend call Urologist to schedule appointment for evaluation of recurrent UTI. Follow-up pending urine culture.

## 2018-04-20 NOTE — ED Provider Notes (Signed)
[facility]    CSN: [id number] Arrival date & time: 04/20/18  1331     History   Chief Complaint Chief Complaint  Patient presents with  . Urinary Tract Infection    HPI Tiffany Singleton is a 37 y.o. female.   37 year old female presents with left flank pain and increase in urinary frequency and urgency for over 1 week. Also noticed bad odor to urine and dysuria. Denies any fever or unusual vaginal discharge. No periods since on Nexplanon. Has history of recurrent UTI. Was last seen about 1 year ago at an Urgent Care, was placed on Keflex and had a urine culture. Culture was positive for E.coli but resistant to Ampicillin/Amoxicillin. No additional medication was given for UTI after reviewing results but patient returned 5 days later and dx with possible renal calculi. Was placed on Flomax and Percocet for pain. Patient has had intermittent symptoms since with more severe symptoms in the past week. She has taken AZO, Cranberry pills and 2 doses of Flagyl which did help decrease the unusual odor.   She also complains of nasal congestion, sore throat, sinus pressure and cough for the past 5 days. Denies any fever or GI symptoms. Has taken Mucinex, Nyquil and Excedrin with some success. No other chronic health issues except recurrent UTI's as described above. No daily medication.   The history is provided by the patient.    History reviewed. No pertinent past medical history.  There are no active problems to display for this patient.   Past Surgical History:  Procedure Laterality Date  . LYMPH GLAND EXCISION    . WISDOM TOOTH EXTRACTION      OB History   None      Home Medications    Prior to Admission medications   Medication Sig Start Date End Date Taking? Authorizing Provider  acetaminophen (TYLENOL) 325 MG tablet Take 650 mg by mouth every 6 (six) hours as needed.      [provider]  nitrofurantoin, macrocrystal-monohydrate, (MACROBID) 100 MG  capsule Take 1 capsule (100 mg total) by mouth 2 (two) times daily for 5 days. 04/20/18 04/25/18  Sudie GrumblingAmyot, Amenda Duclos Berry, NP    Family History Family History  Problem Relation Age of Onset  . Cancer Mother   . Hypertension Father     Social History Social History   Tobacco Use  . Smoking status: Never Smoker  . Smokeless tobacco: Never Used  Substance Use Topics  . Alcohol use: Yes    Comment: occ  . Drug use: No     Allergies   Hydrocodone   Review of Systems Review of Systems  Constitutional: Positive for fatigue. Negative for activity change, appetite change, chills and fever.  HENT: Positive for congestion, postnasal drip, rhinorrhea, sinus pressure, sinus pain and sore throat. Negative for ear discharge, ear pain, facial swelling, mouth sores, nosebleeds, sneezing and trouble swallowing.   Eyes: Negative for pain, discharge, redness and itching.  Respiratory: Positive for cough. Negative for chest tightness, shortness of breath and wheezing.   Gastrointestinal: Negative for abdominal pain, diarrhea, nausea and vomiting.  Genitourinary: Positive for decreased urine volume, dysuria, flank pain, frequency and urgency. Negative for difficulty urinating, genital sores, hematuria, menstrual problem, pelvic pain, vaginal bleeding, vaginal discharge and vaginal pain.  Musculoskeletal: Positive for arthralgias and myalgias. Negative for back pain, neck pain and neck stiffness.  Skin: Negative for color change, rash and wound.  Allergic/Immunologic: Negative for immunocompromised state.  Neurological: Positive for  headaches. Negative for dizziness, tremors, seizures, syncope, weakness, light-headedness and numbness.  Hematological: Negative for adenopathy. Does not bruise/bleed easily.     Physical Exam Triage Vital Signs ED Triage Vitals [04/20/18 1351]  Enc Vitals Group     BP 102/65     Pulse Rate 77     Resp      Temp 98.2 F (36.8 C)     Temp Source Oral     SpO2 100 %      Weight      Height      Head Circumference      Peak Flow      Pain Score 8     Pain Loc      Pain Edu?      Excl. in GC?    No data found.  Updated Vital Signs BP 102/65 (BP Location: Left Arm)   Pulse 77   Temp 98.2 F (36.8 C) (Oral)   SpO2 100%   Visual Acuity Right Eye Distance:   Left Eye Distance:   Bilateral Distance:    Right Eye Near:   Left Eye Near:    Bilateral Near:     Physical Exam  Constitutional: She is oriented to person, place, and time. Vital signs are normal. She appears well-developed and well-nourished. She is cooperative. She does not appear ill. No distress.  Patient sitting on exam table in no acute distress.   HENT:  Head: Normocephalic and atraumatic.  Right Ear: Hearing, tympanic membrane, external ear and ear canal normal.  Left Ear: Hearing, tympanic membrane, external ear and ear canal normal.  Nose: Mucosal edema and rhinorrhea present. Right sinus exhibits no maxillary sinus tenderness and no frontal sinus tenderness. Left sinus exhibits no maxillary sinus tenderness and no frontal sinus tenderness.  Mouth/Throat: Uvula is midline and mucous membranes are normal. Posterior oropharyngeal erythema present.  Eyes: Conjunctivae and EOM are normal.  Neck: Normal range of motion. Neck supple.  Cardiovascular: Normal rate, regular rhythm and normal heart sounds.  No murmur heard. Pulmonary/Chest: Effort normal and breath sounds normal. No respiratory distress. She has no decreased breath sounds. She has no wheezes. She has no rhonchi. She has no rales.  Abdominal: Soft. Normal appearance and bowel sounds are normal. She exhibits no mass. There is no hepatosplenomegaly. There is tenderness in the suprapubic area. There is CVA tenderness (left). There is no rigidity, no rebound and no guarding.  Musculoskeletal: Normal range of motion.  Lymphadenopathy:    She has no cervical adenopathy.  Neurological: She is alert and oriented to person,  place, and time.  Skin: Skin is warm and dry. Capillary refill takes less than 2 seconds. No rash noted.  Psychiatric: She has a normal mood and affect. Her behavior is normal. Judgment and thought content normal.  Vitals reviewed.    UC Treatments / Results  Labs (all labs ordered are listed, but only abnormal results are displayed) Labs Reviewed  POCT URINALYSIS DIP (DEVICE) - Abnormal; Notable for the following components:      Result Value   Nitrite POSITIVE (*)    All other components within normal limits  URINE CULTURE    EKG None  Radiology No results found.  Procedures Procedures (including critical care time)  Medications Ordered in UC Medications - No data to display  Initial Impression / Assessment and Plan / UC Course  I have reviewed the triage vital signs and the nursing notes.  Pertinent labs & imaging results that were  available during my care of the patient were reviewed by me and considered in my medical decision making (see chart for details).    Reviewed urinalysis results with patient- positive nitrites. Discussed possible UTI but symptoms may be due to various etiologies. Will send urine for culture. Recommend start Macrobid 100mg  twice a day as directed. Increase fluid intake. Continue taking Cranberry pills.  Discussed that she probably has a viral URI. Recommend OTC Sudafed every 8 hours as needed for congestion. May take Ibuprofen 600mg  every 8 hours as needed for headache/pain.  Recommend call Urologist tomorrow AM to schedule appointment for further evaluation of recurrent UTI. Follow-up pending urine culture results and with her PCP for URI symptoms if not improving in 4 to 5 days.  Final Clinical Impressions(s) / UC Diagnoses   Final diagnoses:  Dysuria  Urinary frequency  Acute left flank pain  Viral URI     Discharge Instructions     Recommend start Macrobid 100mg  twice a day as directed. Increase fluid intake and may continue taking  Cranberry pills as directed. May also take OTC Sudafed every 8 hours as needed for congestion. May take Ibuprofen 600mg  every 8 hours as needed for pain. Recommend call Urologist to schedule appointment for evaluation of recurrent UTI. Follow-up pending urine culture.     ED Prescriptions    Medication Sig Dispense Auth. Provider   nitrofurantoin, macrocrystal-monohydrate, (MACROBID) 100 MG capsule Take 1 capsule (100 mg total) by mouth 2 (two) times daily for 5 days. 10 capsule Sudie Grumbling, NP     Controlled Substance Prescriptions [place] Controlled Substance Registry consulted? Not Applicable   Sudie Grumbling, NP 04/21/18 1044

## 2018-04-22 ENCOUNTER — Telehealth (HOSPITAL_COMMUNITY): Payer: Self-pay

## 2018-04-22 LAB — URINE CULTURE: Culture: >=100000 — AB

## 2018-04-22 NOTE — Telephone Encounter (Signed)
Urine culture positive for E.coli. This was treated with Macrobid.  Pt called and made aware.

## 2018-09-02 ENCOUNTER — Telehealth: Payer: Self-pay

## 2018-09-02 ENCOUNTER — Other Ambulatory Visit (HOSPITAL_COMMUNITY)
Admission: RE | Admit: 2018-09-02 | Discharge: 2018-09-02 | Disposition: A | Discharge status: Home / Self Care | Payer: Self-pay | Source: Ambulatory Visit | Attending: Family Medicine | Admitting: Family Medicine

## 2018-09-02 ENCOUNTER — Emergency Department (INDEPENDENT_AMBULATORY_CARE_PROVIDER_SITE_OTHER)
Admission: EM | Admit: 2018-09-02 | Discharge: 2018-09-02 | Disposition: A | Discharge status: Home / Self Care | Payer: Self-pay | Source: Home / Self Care | Attending: Family Medicine | Admitting: Family Medicine

## 2018-09-02 ENCOUNTER — Other Ambulatory Visit: Payer: Self-pay

## 2018-09-02 DIAGNOSIS — N898 Other specified noninflammatory disorders of vagina: Secondary | ICD-10-CM

## 2018-09-02 DIAGNOSIS — N760 Acute vaginitis: Secondary | ICD-10-CM | POA: Insufficient documentation

## 2018-09-02 NOTE — ED Provider Notes (Signed)
[facility]    CSN: [id number] Arrival date & time: 09/02/18  1053     History   Chief Complaint Chief Complaint  Patient presents with  . Vaginal Discharge    HPI Tiffany Singleton is a 38 y.o. female.   Patient developed a thin watery malodorous discharge 3 days ago.  Yesterday she noted irritated labia without presence of rash.  She denies abdominal or pelvic pain.  No nausea/vomiting and no fevers, chills, and sweats.  No LMP recorded. Patient has had an implant.  She states that her discharge is similar to past episodes of BV.  The history is provided by the patient.  Vaginal Discharge  Quality:  Malodorous and watery Severity:  Mild Onset quality:  Sudden Duration:  3 days Timing:  Constant Progression:  Unchanged Chronicity:  New Context: spontaneously   Context: not recent antibiotic use   Relieved by:  None tried Worsened by:  Nothing Ineffective treatments:  None tried Associated symptoms: no abdominal pain, no dysuria, no fever, no genital lesions, no nausea, no rash, no urinary frequency, no urinary hesitancy, no urinary incontinence, no vaginal itching and no vomiting     History reviewed. No pertinent past medical history.  There are no active problems to display for this patient.   Past Surgical History:  Procedure Laterality Date  . LYMPH GLAND EXCISION    . WISDOM TOOTH EXTRACTION      OB History   No obstetric history on file.      Home Medications    Prior to Admission medications   Medication Sig Start Date End Date Taking? Authorizing Provider  acetaminophen (TYLENOL) 325 MG tablet Take 650 mg by mouth every 6 (six) hours as needed.      [provider]    Family History Family History  Problem Relation Age of Onset  . Cancer Mother   . Hypertension Father     Social History Social History   Tobacco Use  . Smoking status: Never Smoker  . Smokeless tobacco: Never Used  Substance Use Topics  . Alcohol  use: Yes    Comment: occ  . Drug use: No     Allergies   Hydrocodone   Review of Systems Review of Systems  Constitutional: Negative for activity change, appetite change, chills, fatigue and fever.  Gastrointestinal: Negative for abdominal pain, nausea and vomiting.  Genitourinary: Positive for vaginal discharge. Negative for bladder incontinence, dysuria, genital sores, hesitancy, menstrual problem, pelvic pain, urgency, vaginal bleeding and vaginal pain.  Skin: Positive for color change.  All other systems reviewed and are negative.    Physical Exam Triage Vital Signs ED Triage Vitals  Enc Vitals Group     BP 09/02/18 1121 102/63     Pulse Rate 09/02/18 1121 69     Resp 09/02/18 1121 18     Temp 09/02/18 1121 98.1 F (36.7 C)     Temp Source 09/02/18 1121 Oral     SpO2 09/02/18 1121 99 %     Weight 09/02/18 1123 129 lb (58.5 kg)     Height 09/02/18 1123 5\' 2"  (1.575 m)     Head Circumference --      Peak Flow --      Pain Score 09/02/18 1122 0     Pain Loc --      Pain Edu? --      Excl. in GC? --    No data found.  Updated Vital Signs BP 102/63 (BP  Location: Right Arm)   Pulse 69   Temp 98.1 F (36.7 C) (Oral)   Resp 18   Ht 5\' 2"  (1.575 m)   Wt 58.5 kg   SpO2 99%   BMI 23.59 kg/m   Visual Acuity Right Eye Distance:   Left Eye Distance:   Bilateral Distance:    Right Eye Near:   Left Eye Near:    Bilateral Near:     Physical Exam Vitals signs and nursing note reviewed.  Constitutional:      General: She is not in acute distress.    Appearance: She is not ill-appearing.  HENT:     Head: Normocephalic.     Right Ear: External ear normal.     Left Ear: External ear normal.     Mouth/Throat:     Pharynx: Oropharynx is clear.  Eyes:     Pupils: Pupils are equal, round, and reactive to light.  Cardiovascular:     Rate and Rhythm: Normal rate.     Heart sounds: Normal heart sounds.  Pulmonary:     Breath sounds: Normal breath sounds.    Abdominal:     General: Abdomen is flat.     Tenderness: There is no abdominal tenderness. There is no right CVA tenderness or left CVA tenderness.  Skin:    General: Skin is warm and dry.  Neurological:     Mental Status: She is alert.   Pelvic Exam deferred.  Patient self-obtained vaginal specimen.   UC Treatments / Results  Labs (all labs ordered are listed, but only abnormal results are displayed) Labs Reviewed - No data to display  EKG None  Radiology No results found.  Procedures Procedures (including critical care time)  Medications Ordered in UC Medications - No data to display  Initial Impression / Assessment and Plan / UC Course  I have reviewed the triage vital signs and the nursing notes.  Pertinent labs & imaging results that were available during my care of the patient were reviewed by me and considered in my medical decision making (see chart for details).    Cervicovaginal testing pending. Suspect BV; await test results.   Final Clinical Impressions(s) / UC Diagnoses   Final diagnoses:  Vaginal discharge     Discharge Instructions     We will call your with results of testing.    ED Prescriptions    None         Lattie Haw, MD 09/04/18 0930

## 2018-09-02 NOTE — Discharge Instructions (Addendum)
We will call your with results of testing.

## 2018-09-02 NOTE — ED Triage Notes (Signed)
Pt started with vaginal discharge yesterday.  Genitalia is sore and red, has a fishy smell.

## 2018-09-03 LAB — CERVICOVAGINAL ANCILLARY ONLY
Bacterial vaginitis: POSITIVE — AB
Candida vaginitis: POSITIVE — AB
Chlamydia: NEGATIVE
Neisseria Gonorrhea: NEGATIVE
TRICH (WINDOWPATH): NEGATIVE

## 2018-09-04 ENCOUNTER — Telehealth: Payer: Self-pay

## 2018-09-04 MED ORDER — METRONIDAZOLE 500 MG PO TABS: 500.0000 mg | ORAL_TABLET | Freq: Two times a day (BID) | ORAL | 0 refills | Status: AC

## 2018-09-04 MED ORDER — FLUCONAZOLE 200 MG PO TABS: 200.0000 mg | ORAL_TABLET | Freq: Once | ORAL | 1 refills | Status: AC

## 2018-09-04 NOTE — Telephone Encounter (Signed)
Left message to call UC for results.  Medication sent to pharmacy on record.

## 2018-11-28 ENCOUNTER — Telehealth (INDEPENDENT_AMBULATORY_CARE_PROVIDER_SITE_OTHER): Payer: Self-pay | Admitting: Family Medicine

## 2018-11-28 ENCOUNTER — Other Ambulatory Visit: Payer: Self-pay

## 2018-11-28 DIAGNOSIS — N898 Other specified noninflammatory disorders of vagina: Secondary | ICD-10-CM

## 2018-11-28 MED ORDER — METRONIDAZOLE 500 MG PO TABS: 500.0000 mg | ORAL_TABLET | Freq: Two times a day (BID) | ORAL | 0 refills | Status: DC

## 2018-11-28 NOTE — Progress Notes (Signed)
Jan 28th and Urgent Care for BV and yeast infection. Took medication.   Today now feels like she still has some fishy odar and feels like the BV sx are coming back. And cleaned her are and feels like it is back.   Took TOC Cranberry and garlic suppliments, vit c  500mg  to help, and probiotics. Took for a couple of weeks.    No sexual activity since Feb 2.

## 2018-11-28 NOTE — Progress Notes (Signed)
   Virtual Visit Note  I connected with patient on 11/28/18 at 132pm by telephone and verified that I am speaking with the correct person using two identifiers. Tiffany Singleton is currently located at home and patient is currently with them during visit. The provider, Myles Lipps, MD is located in their office at time of visit.  I discussed the limitations, risks, security and privacy concerns of performing an evaluation and management service by telephone and the availability of in person appointments. I also discussed with the patient that there may be a patient responsible charge related to this service. The patient expressed understanding and agreed to proceed.   CC: vaginal discharge  X 10 day  HPI ? Notices fishy odor with watery discharge, off white Denies any cramping, vaginal irritation, dysuria She has been using refresh gel/douche, taking vitamin C, garlic and cranberry capsules Normally uses summer eve feminine wash Not currently sexually active She recently treated for BV and yeast in Jan 28th 2020   Allergies  Allergen Reactions  . Hydrocodone     Prior to Admission medications   Medication Sig Start Date End Date Taking? Authorizing Provider  acetaminophen (TYLENOL) 325 MG tablet Take 650 mg by mouth every 6 (six) hours as needed.     Yes [provider]  etonogestrel (NEXPLANON) 68 MG IMPL implant Nexplanon 68 mg subdermal implant  Inject 1 implant every day by subcutaneous route.    [provider]    No past medical history on file.  Past Surgical History:  Procedure Laterality Date  . LYMPH GLAND EXCISION    . WISDOM TOOTH EXTRACTION      Social History   Tobacco Use  . Smoking status: Never Smoker  . Smokeless tobacco: Never Used  Substance Use Topics  . Alcohol use: Yes    Comment: occ    Family History  Problem Relation Age of Onset  . Cancer Mother   . Hypertension Father     ROS Per hpi  Objective  Vitals as  reported by the patient: none   ASSESSMENT and PLAN  1. Vaginal discharge Treating presumptively for BV. Discussed stopping douching and feminine washes. Discussed new meds r/se/b and RTC precautions.   Other orders - metroNIDAZOLE (FLAGYL) 500 MG tablet; Take 1 tablet (500 mg total) by mouth 2 (two) times daily.  FOLLOW-UP: prn   The above assessment and management plan was discussed with the patient. The patient verbalized understanding of and has agreed to the management plan. Patient is aware to call the clinic if symptoms persist or worsen. Patient is aware when to return to the clinic for a follow-up visit. Patient educated on when it is appropriate to go to the emergency department.    I provided 10 minutes of non-face-to-face time during this encounter.  Myles Lipps, MD [facility] [address] [place], [place] [address] Ph.  [phone] Fax [phone]

## 2019-02-18 ENCOUNTER — Other Ambulatory Visit: Payer: Self-pay | Admitting: Internal Medicine

## 2019-02-18 DIAGNOSIS — Z20822 Contact with and (suspected) exposure to covid-19: Secondary | ICD-10-CM

## 2019-02-22 LAB — NOVEL CORONAVIRUS, NAA: SARS-CoV-2, NAA: NOT DETECTED

## 2019-04-04 ENCOUNTER — Inpatient Hospital Stay
Admission: RE | Admit: 2019-04-04 | Discharge: 2019-04-04 | Disposition: A | Discharge status: Home / Self Care | Payer: Self-pay | Source: Ambulatory Visit

## 2019-04-08 ENCOUNTER — Encounter (HOSPITAL_COMMUNITY): Payer: Self-pay

## 2019-04-08 ENCOUNTER — Other Ambulatory Visit: Payer: Self-pay

## 2019-04-08 ENCOUNTER — Ambulatory Visit (HOSPITAL_COMMUNITY)
Admission: EM | Admit: 2019-04-08 | Discharge: 2019-04-08 | Disposition: A | Discharge status: Home / Self Care | Payer: Self-pay | Attending: Family Medicine | Admitting: Family Medicine

## 2019-04-08 DIAGNOSIS — N3090 Cystitis, unspecified without hematuria: Secondary | ICD-10-CM | POA: Insufficient documentation

## 2019-04-08 LAB — POCT URINALYSIS DIP (DEVICE)
Bilirubin Urine: NEGATIVE
Glucose, UA: NEGATIVE mg/dL
Ketones, ur: NEGATIVE mg/dL
Leukocytes,Ua: NEGATIVE
Nitrite: NEGATIVE
Protein, ur: NEGATIVE mg/dL
Specific Gravity, Urine: 1.01 (ref 1.005–1.030)
Urobilinogen, UA: 0.2 mg/dL (ref 0.0–1.0)
pH: 6.5 (ref 5.0–8.0)

## 2019-04-08 MED ORDER — CEPHALEXIN 500 MG PO CAPS: 500.0000 mg | ORAL_CAPSULE | Freq: Two times a day (BID) | ORAL | 0 refills | Status: DC

## 2019-04-08 NOTE — ED Triage Notes (Signed)
Pt states she thinks she has a UTI. Pt states this started 3 days ago.

## 2019-04-09 LAB — URINE CULTURE: Culture: <10000 — AB

## 2019-04-09 NOTE — ED Provider Notes (Signed)
[employer] [facility]    ASSESSMENT & PLAN:  1. Cystitis      Meds ordered this encounter  Medications  . cephALEXin (KEFLEX) 500 MG capsule    Sig: Take 1 capsule (500 mg total) by mouth 2 (two) times daily.    Dispense:  10 capsule    Refill:  0    No signs of pyelonephritis. Benign abdominal exam. Discussed. Urine culture sent. Will notify patient when results available. Will follow up with her PCP or here if not showing improvement over the next 48 hours, sooner if needed.  Outlined signs and symptoms indicating need for more acute intervention. Patient verbalized understanding. After Visit Summary given.  SUBJECTIVE:  Tiffany Singleton is a 38 y.o. female who complains of urinary frequency, urgency and dysuria for the past 2-3 d ago. Without associated flank pain, fever, chills, vaginal discharge or bleeding. Gross hematuria: not present. No specific aggravating or alleviating factors reported. No LE edema. Normal PO intake. Without specific abdominal pain. Ambulatory without difficulty. OTC treatment: none reported.  LMP: Patient's last menstrual period was 03/21/2019.  ROS: As in HPI. All other systems negative.   OBJECTIVE:  Vitals:   04/08/19 1920 04/08/19 1921  BP:  109/67  Resp:  16  Temp:  98.5 F (36.9 C)  TempSrc:  Oral  SpO2:  99%  Weight: 65.3 kg    General appearance: alert; no distress HENT: oropharynx: moist Lungs: unlabored respirations Abdomen: soft, non-tender; bowel sounds normal; no masses or organomegaly; no guarding or rebound tenderness Back: no CVA tenderness Extremities: no edema; symmetrical with no gross deformities Skin: warm and dry Neurologic: normal gait Psychological: alert and cooperative; normal mood and affect  Labs Reviewed  POCT URINALYSIS DIP (DEVICE) - Abnormal; Notable for the following components:      Result Value   Hgb urine dipstick TRACE (*)    All other components within normal limits  URINE CULTURE     Allergies  Allergen Reactions  . Hydrocodone     Social History   Socioeconomic History  . Marital status: Single    Spouse name: Not on file  . Number of children: Not on file  . Years of education: Not on file  . Highest education level: Not on file  Occupational History  . Not on file  Social Needs  . Financial resource strain: Not on file  . Food insecurity    Worry: Not on file    Inability: Not on file  . Transportation needs    Medical: Not on file    Non-medical: Not on file  Tobacco Use  . Smoking status: Never Smoker  . Smokeless tobacco: Never Used  Substance and Sexual Activity  . Alcohol use: Yes    Comment: occ  . Drug use: No  . Sexual activity: Not on file  Lifestyle  . Physical activity    Days per week: Not on file    Minutes per session: Not on file  . Stress: Not on file  Relationships  . Social Herbalist on phone: Not on file    Gets together: Not on file    Attends religious service: Not on file    Active member of club or organization: Not on file    Attends meetings of clubs or organizations: Not on file    Relationship status: Not on file  . Intimate partner violence    Fear of current or ex partner: Not on file  Emotionally abused: Not on file    Physically abused: Not on file    Forced sexual activity: Not on file  Other Topics Concern  . Not on file  Social History Narrative  . Not on file   Family History  Problem Relation Age of Onset  . Cancer Mother   . Hypertension Father        Mardella LaymanHagler, Seydina Holliman, MD 04/09/19 [phone]

## 2019-04-13 ENCOUNTER — Telehealth (HOSPITAL_COMMUNITY): Payer: Self-pay | Admitting: Emergency Medicine

## 2019-04-13 MED ORDER — FLUCONAZOLE 150 MG PO TABS: 150.0000 mg | ORAL_TABLET | Freq: Once | ORAL | 0 refills | Status: AC

## 2019-04-13 NOTE — Telephone Encounter (Signed)
Pt called stating keflex gave her a yeast infection. SEnding treatment to preferred pharmacy.

## 2019-04-17 ENCOUNTER — Other Ambulatory Visit: Payer: Self-pay

## 2019-04-17 ENCOUNTER — Other Ambulatory Visit (HOSPITAL_COMMUNITY)
Admission: RE | Admit: 2019-04-17 | Discharge: 2019-04-17 | Disposition: A | Discharge status: Home / Self Care | Payer: Self-pay | Source: Ambulatory Visit | Attending: Family Medicine | Admitting: Family Medicine

## 2019-04-17 ENCOUNTER — Encounter: Payer: Self-pay | Admitting: Family Medicine

## 2019-04-17 ENCOUNTER — Ambulatory Visit (INDEPENDENT_AMBULATORY_CARE_PROVIDER_SITE_OTHER): Payer: Self-pay | Admitting: Family Medicine

## 2019-04-17 VITALS — BP 109/71 | HR 63 | Temp 99.0°F | Ht 62.4 in | Wt 148.0 lb

## 2019-04-17 DIAGNOSIS — R87619 Unspecified abnormal cytological findings in specimens from cervix uteri: Secondary | ICD-10-CM | POA: Insufficient documentation

## 2019-04-17 DIAGNOSIS — R350 Frequency of micturition: Secondary | ICD-10-CM

## 2019-04-17 LAB — POCT URINALYSIS DIP (MANUAL ENTRY)
Bilirubin, UA: NEGATIVE
Blood, UA: NEGATIVE
Glucose, UA: NEGATIVE mg/dL
Ketones, POC UA: NEGATIVE mg/dL
Leukocytes, UA: NEGATIVE
Nitrite, UA: NEGATIVE
Protein Ur, POC: NEGATIVE mg/dL
Spec Grav, UA: 1.025 (ref 1.010–1.025)
Urobilinogen, UA: 0.2 E.U./dL
pH, UA: 8 (ref 5.0–8.0)

## 2019-04-17 LAB — POC MICROSCOPIC URINALYSIS (UMFC): Mucus: ABSENT

## 2019-04-17 NOTE — Patient Instructions (Signed)
° ° ° °  If you have lab work done today you will be contacted with your lab results within the next 2 weeks.  If you have not heard from us then please contact us. The fastest way to get your results is to register for My Chart. ° ° °IF you received an x-ray today, you will receive an invoice from [facility]. Please contact [facility] at [phone] with questions or concerns regarding your invoice.  ° °IF you received labwork today, you will receive an invoice from [facility]. Please contact [facility] at [phone] with questions or concerns regarding your invoice.  ° °Our billing staff will not be able to assist you with questions regarding bills from these companies. ° °You will be contacted with the lab results as soon as they are available. The fastest way to get your results is to activate your My Chart account. Instructions are located on the last page of this paperwork. If you have not heard from us regarding the results in 2 weeks, please contact this office. °  ° ° ° °

## 2019-04-17 NOTE — Progress Notes (Signed)
9/11/20203:17 PM  Tiffany Singleton [birth year], 38 y.o., female [id number]  Chief Complaint  Patient presents with  . Urinary Urgency    after sex has the feeling of uti    HPI:   Patient is a 38 y.o. female who presents today for urinary urgency  Having intermittent frequency with slight dysuria at end of urination, no vaginal discharge, about to start cycle, no pelvic pain No fever or chills, abd pain, nausea or vomiting New sex partner, reports neg GC/Ch test Uses feminine wash, not drinking enough water Seen in urgent care on 04/08/2019 - started keflex and diflucan urcx < 10,000 cfu  Depression screen [facility] 2/9 04/17/2019 11/28/2018 05/21/2017  Decreased Interest 0 0 0  Down, Depressed, Hopeless 0 0 0  PHQ - 2 Score 0 0 0    Fall Risk  04/17/2019 11/28/2018 05/21/2017 05/16/2017 07/11/2016  Falls in the past year? 0 0 No No No  Number falls in past yr: 0 0 - - -  Injury with Fall? 0 0 - - -     Allergies  Allergen Reactions  . Hydrocodone     Prior to Admission medications   Medication Sig Start Date End Date Taking? Authorizing Provider  acetaminophen (TYLENOL) 325 MG tablet Take 650 mg by mouth every 6 (six) hours as needed.      [provider]  cephALEXin (KEFLEX) 500 MG capsule Take 1 capsule (500 mg total) by mouth 2 (two) times daily. 04/08/19   Vanessa Kick, MD  etonogestrel (NEXPLANON) 68 MG IMPL implant Nexplanon 68 mg subdermal implant  Inject 1 implant every day by subcutaneous route.    [provider]  metroNIDAZOLE (FLAGYL) 500 MG tablet Take 1 tablet (500 mg total) by mouth 2 (two) times daily. 11/28/18   Rutherford Guys, MD    No past medical history on file.  Past Surgical History:  Procedure Laterality Date  . LYMPH GLAND EXCISION    . WISDOM TOOTH EXTRACTION      Social History   Tobacco Use  . Smoking status: Never Smoker  . Smokeless tobacco: Never Used  Substance Use Topics  . Alcohol use: Yes    Comment: occ     Family History  Problem Relation Age of Onset  . Cancer Mother   . Hypertension Father     ROS Per hpi  OBJECTIVE:  Today's Vitals   04/17/19 1522  BP: 109/71  Pulse: 63  Temp: 99 F (37.2 C)  SpO2: 100%  Weight: 148 lb (67.1 kg)  Height: 5' 2.4" (1.585 m)   Body mass index is 26.72 kg/m.   Physical Exam Vitals signs and nursing note reviewed.  Constitutional:      Appearance: She is well-developed.  HENT:     Head: Normocephalic and atraumatic.  Eyes:     General: No scleral icterus.    Conjunctiva/sclera: Conjunctivae normal.     Pupils: Pupils are equal, round, and reactive to light.  Neck:     Musculoskeletal: Neck supple.  Pulmonary:     Effort: Pulmonary effort is normal.  Skin:    General: Skin is warm and dry.  Neurological:     Mental Status: She is alert and oriented to person, place, and time.       Results for orders placed or performed in visit on 04/17/19 (from the past 24 hour(s))  POCT urinalysis dipstick     Status: None   Collection Time: 04/17/19  3:07 PM  Result  Value Ref Range   Color, UA yellow yellow   Clarity, UA clear clear   Glucose, UA negative negative mg/dL   Bilirubin, UA negative negative   Ketones, POC UA negative negative mg/dL   Spec Grav, UA [id number] [id number] - 1.025   Blood, UA negative negative   pH, UA 8.0 5.0 - 8.0   Protein Ur, POC negative negative mg/dL   Urobilinogen, UA 0.2 0.2 or 1.0 E.U./dL   Nitrite, UA Negative Negative   Leukocytes, UA Negative Negative  POCT Microscopic Urinalysis ([facility])     Status: Abnormal   Collection Time: 04/17/19  3:15 PM  Result Value Ref Range   WBC,UR,HPF,POC None None WBC/hpf   RBC,UR,HPF,POC None None RBC/hpf   Bacteria None None, Too numerous to count   Mucus Absent Absent   Epithelial Cells, UR Per Microscopy Few (A) None, Too numerous to count cells/hpf    No results found.   ASSESSMENT and PLAN  1. Urinary frequency Discussed supportive measures, push fluids,  stop feminine washes, sitz bath for irritation, labs pending - POCT urinalysis dipstick - POCT Microscopic Urinalysis ([facility]) - Urine cytology ancillary only  Return if symptoms worsen or fail to improve.    Myles LippsIrma M Santiago, MD Primary Care at [facility] [address] [place], [place] [address] Ph.  [phone] Fax [phone]

## 2019-04-22 LAB — URINE CYTOLOGY ANCILLARY ONLY
Chlamydia: NEGATIVE
Neisseria Gonorrhea: NEGATIVE
Trichomonas: NEGATIVE

## 2019-04-24 LAB — URINE CYTOLOGY ANCILLARY ONLY
Bacterial vaginitis: NEGATIVE
Candida vaginitis: NEGATIVE

## 2019-05-26 ENCOUNTER — Ambulatory Visit: Payer: Self-pay | Admitting: Family Medicine

## 2019-12-27 LAB — HM PAP SMEAR: HPV, high-risk: NEGATIVE

## 2019-12-28 ENCOUNTER — Telehealth: Payer: Self-pay | Admitting: Genetic Counselor

## 2019-12-28 NOTE — Telephone Encounter (Signed)
Received a genetic counseling referral from Dr. Ellyn Hack for fhx of ovarian cancer. Pt has been scheduled to see Irving Burton on 6/7 at 9am for a mychart video visit. I scheduled a lab on 6/9 at 10am at the request of the pt.

## 2020-01-12 ENCOUNTER — Inpatient Hospital Stay: Payer: 59 | Attending: Genetic Counselor | Admitting: Genetic Counselor

## 2020-01-12 ENCOUNTER — Encounter: Payer: Self-pay | Admitting: Genetic Counselor

## 2020-01-12 DIAGNOSIS — Z801 Family history of malignant neoplasm of trachea, bronchus and lung: Secondary | ICD-10-CM | POA: Insufficient documentation

## 2020-01-12 DIAGNOSIS — Z803 Family history of malignant neoplasm of breast: Secondary | ICD-10-CM | POA: Diagnosis not present

## 2020-01-12 DIAGNOSIS — Z8042 Family history of malignant neoplasm of prostate: Secondary | ICD-10-CM | POA: Diagnosis not present

## 2020-01-12 DIAGNOSIS — Z8041 Family history of malignant neoplasm of ovary: Secondary | ICD-10-CM | POA: Diagnosis not present

## 2020-01-12 NOTE — Progress Notes (Signed)
REFERRING PROVIDER: Janyth Contes, MD [place],   [address]  PRIMARY PROVIDER:  Patient, No Pcp Per  PRIMARY REASON FOR VISIT:  1. Family history of ovarian cancer   2. Family history of prostate cancer   3. Family history of breast cancer   4. Family history of lung cancer      I connected with Tiffany Singleton on 01/12/2020 at 9:00 am EDT by Mychart video conference and verified that I am speaking with the correct person using two identifiers.   Patient location: Home Provider location: [facility] office  HISTORY OF PRESENT ILLNESS:   Tiffany Singleton, a 39 y.o. female, was seen for a [facility] cancer genetics consultation at the request of Dr. Sandford Craze due to a family history of cancer.  Tiffany Singleton presents to clinic today to discuss the possibility of a hereditary predisposition to cancer, genetic testing, and to further clarify her future cancer risks, as well as potential cancer risks for family members.   Tiffany Singleton does not have a personal history of cancer.     RISK FACTORS:  Menarche was at age unknown.  First live birth at age 64.  OCP use for approximately 2 years.  Ovaries intact: yes.  Hysterectomy: no.  Menopausal status: premenopausal.  HRT use: 0 years. Colonoscopy: n/a. Mammogram within the last year: no. Number of breast biopsies: 0. Any excessive radiation exposure in the past: no   Past Medical History:  Diagnosis Date  . Family history of breast cancer   . Family history of lung cancer   . Family history of ovarian cancer   . Family history of prostate cancer     Past Surgical History:  Procedure Laterality Date  . LYMPH GLAND EXCISION    . WISDOM TOOTH EXTRACTION      Social History   Socioeconomic History  . Marital status: Single    Spouse name: Not on file  . Number of children: Not on file  . Years of education: Not on file  . Highest education level: Not on file  Occupational History  . Not on  file  Tobacco Use  . Smoking status: Never Smoker  . Smokeless tobacco: Never Used  Substance and Sexual Activity  . Alcohol use: Yes    Comment: occ  . Drug use: No  . Sexual activity: Not on file  Other Topics Concern  . Not on file  Social History Narrative  . Not on file   Social Determinants of Health   Financial Resource Strain:   . Difficulty of Paying Living Expenses:   Food Insecurity:   . Worried About Charity fundraiser in the Last Year:   . Arboriculturist in the Last Year:   Transportation Needs:   . Film/video editor (Medical):   Marland Kitchen Lack of Transportation (Non-Medical):   Physical Activity:   . Days of Exercise per Week:   . Minutes of Exercise per Session:   Stress:   . Feeling of Stress :   Social Connections:   . Frequency of Communication with Friends and Family:   . Frequency of Social Gatherings with Friends and Family:   . Attends Religious Services:   . Active Member of Clubs or Organizations:   . Attends Archivist Meetings:   Marland Kitchen Marital Status:      FAMILY HISTORY:  We obtained a detailed, 4-generation family history.  Significant diagnoses are listed below: Family History  Problem Relation  Age of Onset  . Lung cancer Mother 22  . Ovarian cancer Mother 66  . Brain cancer Mother   . Hypertension Father   . Prostate cancer Father   . Breast cancer Paternal Grandmother        dx. in her 45s  . Ovarian cancer Paternal Grandmother        dx. in her late 10s   Tiffany Singleton has two sons (ages 38 and 7). She has a brother (age 22) and a sister (age 82). None of these family members have had cancer.  Tiffany Singleton mother is 10 and has a history of lung cancer diagnosed around the age of 66, then ovarian cancer, peritoneal cancer, and brain cancer. Tiffany Singleton believes her mother's lung cancer had metastasized to her ovaries, rather than these cancers being separate primaries. Tiffany Singleton has one maternal aunt and two maternal uncles,  one of whom died when he was older than 56. None have had cancer to her knowledge. Her maternal grandparents died when they were older than 30. There are no other known diagnoses of cancer on the maternal side of the family.  Tiffany Singleton father is 68 and has a history of non-metastatic prostate cancer diagnosed when he was around 68. She has two paternal uncles and one paternal aunt. Her paternal grandmother died at the age of 81 and had a history of breast cancer diagnosed in her 27s and ovarian cancer diagnosed in her late 46s. Her paternal grandfather died in his 47s. There are no other known diagnoses of cancer on the paternal side of the family.   Tiffany Singleton is unaware of previous family history of genetic testing for hereditary cancer risks. Patient's maternal ancestors are of Black/African American descent, and paternal ancestors are of Black/African American, [place], and Native American descent. There is no reported Ashkenazi Jewish ancestry. There is no known consanguinity.  GENETIC COUNSELING ASSESSMENT: Tiffany Singleton is a 39 y.o. female with a family history of prostate, ovarian, and breast cancer, which is somewhat suggestive of a hereditary cancer syndrome and predisposition to cancer. We, therefore, discussed and recommended the following at today's visit.   DISCUSSION: We discussed that approximately 5-10% of cancer is hereditary. Most cases of hereditary breast, ovarian, and prostate cancers are associated with the BRCA genes, although there are other genes that can be associated with hereditary breast, ovarian, and/or prostate cancer syndromes. We discussed that testing is beneficial for several reasons, including knowing about other cancer risks, identifying potential screening and risk-reduction options that may be appropriate, and to understand if other family members could be at risk for cancer and allow them to undergo genetic testing.  We reviewed the characteristics, features and  inheritance patterns of hereditary cancer syndromes. We also discussed genetic testing, including the appropriate family members to test, the process of testing, insurance coverage and turn-around-time for results. We discussed the implications of a negative, positive and/or variant of uncertain significant result. We recommended Tiffany Singleton pursue genetic testing for the Common Hereditary Cancers panel.   The Common Hereditary Cancers Panel offered by [facility] includes sequencing and/or deletion duplication testing of the following 48 genes: APC, ATM, AXIN2, BARD1, BMPR1A, BRCA1, BRCA2, BRIP1, CDH1, CDK4, CDKN2A (p14ARF), CDKN2A (p16INK4a), CHEK2, CTNNA1, DICER1, EPCAM (Deletion/duplication testing only), GREM1 (promoter region deletion/duplication testing only), KIT, MEN1, MLH1, MSH2, MSH3, MSH6, MUTYH, NBN, NF1, NHTL1, PALB2, PDGFRA, PMS2, POLD1, POLE, PTEN, RAD50, RAD51C, RAD51D, RNF43, SDHB, SDHC, SDHD, SMAD4, SMARCA4. STK11, TP53, TSC1, TSC2, and VHL.  The following genes are evaluated for sequence changes only: SDHA and HOXB13 c.251G>A variant only.  Based on Tiffany Singleton's family history of cancer, she meets medical criteria for genetic testing. Despite that she meets criteria, she may still have an out of pocket cost. We discussed that if her out of pocket cost for testing is over $100, the laboratory will reach out to let her know.  If the out of pocket cost of testing is less than $100 she will be billed by the genetic testing laboratory.   We discussed that some people do not want to undergo genetic testing due to fear of genetic discrimination.  A federal law called the Genetic Information Non-Discrimination Act (GINA) of 2008 helps protect individuals against genetic discrimination based on their genetic test results.  It impacts both health insurance and employment.  With health insurance, it protects against increased premiums, being kicked off insurance or being forced to take a test in order to  be insured.  For employment it protects against hiring, firing and promoting decisions based on genetic test results.  Health status due to a cancer diagnosis is not protected under GINA.  Additionally, life, disability, and long-term care insurance is not protected under GINA.   PLAN:  Tiffany Singleton would like some additional time to consider genetic testing. We will reach out to her in approximately one week to answer any additional questions and coordinate testing at that time, if desired. In the meantime, we recommend Tiffany Singleton continue to follow the cancer screening guidelines given by her primary healthcare provider.  Based on Tiffany Singleton's family history, we recommended her father, who was diagnosed with prostate cancer at age 20, and her mother, if her ovarian cancer was a separate primary diagnosis, have genetic counseling and testing. Tiffany Singleton will let us know if we can be of any assistance in coordinating genetic counseling and/or testing for these family members.   Tiffany Singleton questions were answered to her satisfaction today. Our contact information was provided should additional questions or concerns arise. Thank you for the referral and allowing [place] to share in the care of your patient.   Clint Guy, [place], [facility] Licensed, Certified Dispensing [email or web address] Phone: [phone]  The patient was seen for a total of 35 minutes in face-to-face genetic counseling.  This patient was discussed with Drs. Magrinat, Lindi Adie and/or Burr Medico who agrees with the above.    _______________________________________________________________________ For Office Staff:  Number of people involved in session: 1 Was an Intern/ student involved with case: no

## 2020-01-13 ENCOUNTER — Inpatient Hospital Stay: Payer: 59

## 2020-01-20 ENCOUNTER — Telehealth: Payer: Self-pay | Admitting: Genetic Counselor

## 2020-01-20 NOTE — Telephone Encounter (Signed)
Called Tiffany Singleton to follow-up regarding her thoughts on genetic testing. She expressed that she would like to have the genetic testing done in the future, but she is not emotionally ready to receive those results at this time. She thinks she may reconsider testing in September or October. Discussed that we are happy to set this up with her at any point in the future. She has our contact information and will reach out when she would like to proceed with genetic testing.

## 2020-03-24 ENCOUNTER — Encounter: Payer: Self-pay | Admitting: Family Medicine

## 2020-03-24 ENCOUNTER — Ambulatory Visit (INDEPENDENT_AMBULATORY_CARE_PROVIDER_SITE_OTHER): Payer: 59 | Admitting: Family Medicine

## 2020-03-24 ENCOUNTER — Other Ambulatory Visit: Payer: Self-pay

## 2020-03-24 VITALS — BP 111/73 | HR 72 | Temp 98.0°F | Ht 62.4 in | Wt 158.2 lb

## 2020-03-24 DIAGNOSIS — F5104 Psychophysiologic insomnia: Secondary | ICD-10-CM

## 2020-03-24 DIAGNOSIS — F4321 Adjustment disorder with depressed mood: Secondary | ICD-10-CM | POA: Diagnosis not present

## 2020-03-24 DIAGNOSIS — R358 Other polyuria: Secondary | ICD-10-CM | POA: Diagnosis not present

## 2020-03-24 DIAGNOSIS — R3589 Other polyuria: Secondary | ICD-10-CM

## 2020-03-24 LAB — POCT URINALYSIS DIP (MANUAL ENTRY)
Bilirubin, UA: NEGATIVE
Glucose, UA: NEGATIVE mg/dL
Ketones, POC UA: NEGATIVE mg/dL
Leukocytes, UA: NEGATIVE
Nitrite, UA: NEGATIVE
Protein Ur, POC: NEGATIVE mg/dL
Spec Grav, UA: >=1.03 — AB (ref 1.010–1.025)
Urobilinogen, UA: 0.2 E.U./dL
pH, UA: 7 (ref 5.0–8.0)

## 2020-03-24 MED ORDER — QUETIAPINE FUMARATE 25 MG PO TABS: 12.2500 mg | ORAL_TABLET | Freq: Every evening | ORAL | 2 refills | Status: DC | PRN

## 2020-03-24 NOTE — Progress Notes (Signed)
8/19/20213:29 PM  Tiffany Singleton June 03, 1981, 39 y.o., female [id number]  Chief Complaint  Patient presents with  . Urinary Frequency    believes she has uti  . Insomnia    HPI:   Patient is a 39 y.o. female  who presents today with couple of concerns  Patient reports problems sleeping with significant anxiety since the death of her mother a month ago She will be starting counseling thru hospice She has good family support She has no previous issues with depression or anxiety  She is also wondering about a UTI as having frequency She denies any dysuria or hematuria   Depression screen [facility] 2/9 04/17/2019 11/28/2018 05/21/2017  Decreased Interest 0 0 0  Down, Depressed, Hopeless 0 0 0  PHQ - 2 Score 0 0 0    Fall Risk  03/24/2020 04/17/2019 11/28/2018 05/21/2017 05/16/2017  Falls in the past year? 0 0 0 No No  Number falls in past yr: 0 0 0 - -  Injury with Fall? 0 0 0 - -     Allergies  Allergen Reactions  . Hydrocodone     Prior to Admission medications   Medication Sig Start Date End Date Taking? Authorizing Provider  acetaminophen (TYLENOL) 325 MG tablet Take 650 mg by mouth every 6 (six) hours as needed.     Yes [provider]  cephALEXin (KEFLEX) 500 MG capsule Take 1 capsule (500 mg total) by mouth 2 (two) times daily. 04/08/19  Yes Mardella Layman, MD  etonogestrel (NEXPLANON) 68 MG IMPL implant Nexplanon 68 mg subdermal implant  Inject 1 implant every day by subcutaneous route.   Yes [provider]  metroNIDAZOLE (FLAGYL) 500 MG tablet Take 1 tablet (500 mg total) by mouth 2 (two) times daily. 11/28/18  Yes Myles Lipps, MD    Past Medical History:  Diagnosis Date  . Family history of breast cancer   . Family history of lung cancer   . Family history of ovarian cancer   . Family history of prostate cancer     Past Surgical History:  Procedure Laterality Date  . LYMPH GLAND EXCISION    . WISDOM TOOTH EXTRACTION      Social History    Tobacco Use  . Smoking status: Never Smoker  . Smokeless tobacco: Never Used  Substance Use Topics  . Alcohol use: Yes    Comment: occ    Family History  Problem Relation Age of Onset  . Lung cancer Mother 66  . Ovarian cancer Mother 52  . Brain cancer Mother   . Hypertension Father   . Prostate cancer Father   . Breast cancer Paternal Grandmother        dx. in her 85s  . Ovarian cancer Paternal Grandmother        dx. in her late 29s    ROS Per hpi  OBJECTIVE:  Today's Vitals   03/24/20 1454  BP: 111/73  Pulse: 72  Temp: 98 F (36.7 C)  SpO2: 100%  Weight: 158 lb 3.2 oz (71.8 kg)  Height: 5' 2.4" (1.585 m)   Body mass index is 28.57 kg/m.   Physical Exam Vitals and nursing note reviewed.  Constitutional:      Appearance: She is well-developed.  HENT:     Head: Normocephalic and atraumatic.  Eyes:     General: No scleral icterus.    Conjunctiva/sclera: Conjunctivae normal.     Pupils: Pupils are equal, round, and reactive to light.  Pulmonary:  Effort: Pulmonary effort is normal.  Musculoskeletal:     Cervical back: Neck supple.  Skin:    General: Skin is warm and dry.  Neurological:     Mental Status: She is alert and oriented to person, place, and time.     Results for orders placed or performed in visit on 03/24/20 (from the past 24 hour(s))  POCT urinalysis dipstick     Status: Abnormal   Collection Time: 03/24/20  3:03 PM  Result Value Ref Range   Color, UA yellow yellow   Clarity, UA clear clear   Glucose, UA negative negative mg/dL   Bilirubin, UA negative negative   Ketones, POC UA negative negative mg/dL   Spec Grav, UA >=5.397 (A) 1.010 - 1.025   Blood, UA trace-lysed (A) negative   pH, UA 7.0 5.0 - 8.0   Protein Ur, POC negative negative mg/dL   Urobilinogen, UA 0.2 0.2 or 1.0 E.U./dL   Nitrite, UA Negative Negative   Leukocytes, UA Negative Negative    No results found.   ASSESSMENT and PLAN  1. Grief 2.  Psychophysiological insomnia Patient to start counseling. Trial of seroquel at bedtime to help with insomnia and anxiety, reviewed r/se/b  3. Polyuria - POCT urinalysis dipstick - normal  Other orders - QUEtiapine (SEROQUEL) 25 MG tablet; Take 0.5 tablets (12.5 mg total) by mouth at bedtime as needed (insmonia, anxiety).  Return in about 1 week (around 03/31/2020) for in office or virtual.    Myles Lipps, MD Primary Care at [facility] [address] [place], [place] [address] Ph.  [phone] Fax [phone]

## 2020-03-29 ENCOUNTER — Encounter: Payer: Self-pay | Admitting: Emergency Medicine

## 2020-03-29 ENCOUNTER — Other Ambulatory Visit: Payer: Self-pay

## 2020-03-29 ENCOUNTER — Ambulatory Visit (INDEPENDENT_AMBULATORY_CARE_PROVIDER_SITE_OTHER): Payer: 59 | Admitting: Emergency Medicine

## 2020-03-29 VITALS — BP 100/62 | HR 67 | Temp 98.0°F | Resp 16 | Ht 62.0 in | Wt 157.0 lb

## 2020-03-29 DIAGNOSIS — H6690 Otitis media, unspecified, unspecified ear: Secondary | ICD-10-CM | POA: Diagnosis not present

## 2020-03-29 DIAGNOSIS — H9201 Otalgia, right ear: Secondary | ICD-10-CM

## 2020-03-29 MED ORDER — AZITHROMYCIN 250 MG PO TABS: ORAL_TABLET | ORAL | 0 refills | Status: DC

## 2020-03-29 MED ORDER — HYDROCORTISONE-ACETIC ACID 1-2 % OT SOLN: 3.0000 [drp] | Freq: Three times a day (TID) | OTIC | 1 refills | Status: DC

## 2020-03-29 NOTE — Patient Instructions (Addendum)
   If you have lab work done today you will be contacted with your lab results within the next 2 weeks.  If you have not heard from us then please contact us. The fastest way to get your results is to register for My Chart.   IF you received an x-ray today, you will receive an invoice from [facility]. Please contact [facility] at [phone] with questions or concerns regarding your invoice.   IF you received labwork today, you will receive an invoice from [facility]. Please contact [facility] at [phone] with questions or concerns regarding your invoice.   Our billing staff will not be able to assist you with questions regarding bills from these companies.  You will be contacted with the lab results as soon as they are available. The fastest way to get your results is to activate your My Chart account. Instructions are located on the last page of this paperwork. If you have not heard from us regarding the results in 2 weeks, please contact this office.     Earache, Adult An earache, or ear pain, can be caused by many things, including:  An infection.  Ear wax buildup.  Ear pressure.  Something in the ear that should not be there (foreign body).  A sore throat.  Tooth problems.  Jaw problems. Treatment of the earache will depend on the cause. If the cause is not clear or cannot be determined, you may need to watch your symptoms until your earache goes away or until a cause is found. Follow these instructions at home: Medicines  Take or apply over-the-counter and prescription medicines only as told by your health care provider.  If you were prescribed an antibiotic medicine, use it as told by your health care provider. Do not stop using the antibiotic even if you start to feel better.  Do not put anything in your ear other than medicine that is prescribed by your health care provider. Managing pain If directed, apply heat to the affected area as often  as told by your health care provider. Use the heat source that your health care provider recommends, such as a moist heat pack or a heating pad.  Place a towel between your skin and the heat source.  Leave the heat on for 20-30 minutes.  Remove the heat if your skin turns bright red. This is especially important if you are unable to feel pain, heat, or cold. You may have a greater risk of getting burned. If directed, put ice on the affected area as often as told by your health care provider. To do this:      Put ice in a plastic bag.  Place a towel between your skin and the bag.  Leave the ice on for 20 minutes, 2-3 times a day. General instructions  Pay attention to any changes in your symptoms.  Try resting in an upright position instead of lying down. This may help to reduce pressure in your ear and relieve pain.  Chew gum if it helps to relieve your ear pain.  Treat any allergies as told by your health care provider.  Drink enough fluid to keep your urine pale yellow.  It is up to you to get the results of any tests that were done. Ask your health care provider, or the department that is doing the tests, when your results will be ready.  Keep all follow-up visits as told by your health care provider. This is important. Contact a health   care provider if:  Your pain does not improve within 2 days.  Your earache gets worse.  You have new symptoms.  You have a fever. Get help right away if you:  Have a severe headache.  Have a stiff neck.  Have trouble swallowing.  Have redness or swelling behind your ear.  Have fluid or blood coming from your ear.  Have hearing loss.  Feel dizzy. Summary  An earache, or ear pain, can be caused by many things.  Treatment of the earache will depend on the cause. Follow recommendations from your health care provider to treat your ear pain.  If the cause is not clear or cannot be determined, you may need to watch your  symptoms until your earache goes away or until a cause is found.  Keep all follow-up visits as told by your health care provider. This is important. This information is not intended to replace advice given to you by your health care provider. Make sure you discuss any questions you have with your health care provider. Document Revised: 02/28/2019 Document Reviewed: 02/28/2019 Elsevier Patient Education  2020 [employer]  

## 2020-03-29 NOTE — Progress Notes (Signed)
Tiffany Singleton 39 y.o.   Chief Complaint  Patient presents with  . Ear Pain    for 3 months patient denies drainage or bleeding and feels pressure    HISTORY OF PRESENT ILLNESS: This is a 39 y.o. female complaining of right ear pain for the past 3 months. Uses Q-tips on a regular basis.  No other significant associated symptoms. Not vaccinated against Covid.  HPI   Prior to Admission medications   Medication Sig Start Date End Date Taking? Authorizing Provider  QUEtiapine (SEROQUEL) 25 MG tablet Take 0.5 tablets (12.5 mg total) by mouth at bedtime as needed (insmonia, anxiety). 03/24/20  Yes Myles LippsSantiago, Irma M, MD  acetaminophen (TYLENOL) 325 MG tablet Take 650 mg by mouth every 6 (six) hours as needed.   Patient not taking: Reported on 03/29/2020    [provider]  cephALEXin (KEFLEX) 500 MG capsule Take 1 capsule (500 mg total) by mouth 2 (two) times daily. Patient not taking: Reported on 03/29/2020 04/08/19   Mardella LaymanHagler, Brian, MD  etonogestrel (NEXPLANON) 68 MG IMPL implant Nexplanon 68 mg subdermal implant  Inject 1 implant every day by subcutaneous route.    [provider]    Allergies  Allergen Reactions  . Hydrocodone Hives and Itching    There are no problems to display for this patient.   Past Medical History:  Diagnosis Date  . Family history of breast cancer   . Family history of lung cancer   . Family history of ovarian cancer   . Family history of prostate cancer     Past Surgical History:  Procedure Laterality Date  . LYMPH GLAND EXCISION    . WISDOM TOOTH EXTRACTION      Social History   Socioeconomic History  . Marital status: Single    Spouse name: Not on file  . Number of children: Not on file  . Years of education: Not on file  . Highest education level: Not on file  Occupational History  . Not on file  Tobacco Use  . Smoking status: Never Smoker  . Smokeless tobacco: Never Used  Vaping Use  . Vaping Use: Never used   Substance and Sexual Activity  . Alcohol use: Yes    Comment: occ  . Drug use: No  . Sexual activity: Not on file  Other Topics Concern  . Not on file  Social History Narrative  . Not on file   Social Determinants of Health   Financial Resource Strain:   . Difficulty of Paying Living Expenses: Not on file  Food Insecurity:   . Worried About Programme researcher, broadcasting/film/videounning Out of Food in the Last Year: Not on file  . Ran Out of Food in the Last Year: Not on file  Transportation Needs:   . Lack of Transportation (Medical): Not on file  . Lack of Transportation (Non-Medical): Not on file  Physical Activity:   . Days of Exercise per Week: Not on file  . Minutes of Exercise per Session: Not on file  Stress:   . Feeling of Stress : Not on file  Social Connections:   . Frequency of Communication with Friends and Family: Not on file  . Frequency of Social Gatherings with Friends and Family: Not on file  . Attends Religious Services: Not on file  . Active Member of Clubs or Organizations: Not on file  . Attends BankerClub or Organization Meetings: Not on file  . Marital Status: Not on file  Intimate Partner Violence:   .  Fear of Current or Ex-Partner: Not on file  . Emotionally Abused: Not on file  . Physically Abused: Not on file  . Sexually Abused: Not on file    Family History  Problem Relation Age of Onset  . Lung cancer Mother 45  . Ovarian cancer Mother 29  . Brain cancer Mother   . Hypertension Father   . Prostate cancer Father   . Breast cancer Paternal Grandmother        dx. in her 69s  . Ovarian cancer Paternal Grandmother        dx. in her late 17s     Review of Systems  Constitutional: Negative.  Negative for chills and fever.  HENT: Positive for ear pain. Negative for congestion and sore throat.   Respiratory: Negative.  Negative for cough and shortness of breath.   Cardiovascular: Negative.  Negative for chest pain and palpitations.  Gastrointestinal: Negative for abdominal pain,  diarrhea, nausea and vomiting.  Genitourinary: Negative.   Skin: Negative.   Neurological: Negative for dizziness and headaches.  All other systems reviewed and are negative.  Today's Vitals   03/29/20 1510  BP: 100/62  Pulse: 67  Resp: 16  Temp: 98 F (36.7 C)  TempSrc: Temporal  SpO2: 98%  Weight: 157 lb (71.2 kg)  Height: 5\' 2"  (1.575 m)   Body mass index is 28.72 kg/m.   Physical Exam Vitals reviewed.  Constitutional:      Appearance: Normal appearance.  HENT:     Head: Normocephalic.     Right Ear: Tympanic membrane and external ear normal. No decreased hearing noted. Tenderness present.     Left Ear: Tympanic membrane, ear canal and external ear normal.     Ears:     Comments: Hyperemic and tender external canal    Mouth/Throat:     Mouth: Mucous membranes are moist.     Pharynx: Oropharynx is clear.  Eyes:     Extraocular Movements: Extraocular movements intact.     Pupils: Pupils are equal, round, and reactive to light.  Cardiovascular:     Rate and Rhythm: Normal rate and regular rhythm.     Pulses: Normal pulses.     Heart sounds: Normal heart sounds.  Pulmonary:     Effort: Pulmonary effort is normal.     Breath sounds: Normal breath sounds.  Musculoskeletal:        General: Normal range of motion.     Cervical back: Normal range of motion and neck supple.  Skin:    General: Skin is warm and dry.  Neurological:     General: No focal deficit present.     Mental Status: She is alert and oriented to person, place, and time.  Psychiatric:        Mood and Affect: Mood normal.        Behavior: Behavior normal.      ASSESSMENT & PLAN: Tiffany Singleton was seen today for ear pain.  Diagnoses and all orders for this visit:  Acute otalgia, right  Ear infection -     azithromycin (ZITHROMAX) 250 MG tablet; Sig as indicated -     acetic acid-hydrocortisone (VOSOL-HC) OTIC solution; Place 3 drops into the left ear 3 (three) times daily.    Patient  Instructions       If you have lab work done today you will be contacted with your lab results within the next 2 weeks.  If you have not heard from Lawanna Kobus then please contact us.  The fastest way to get your results is to register for My Chart.   IF you received an x-ray today, you will receive an invoice from [facility]. Please contact [facility] at [phone] with questions or concerns regarding your invoice.   IF you received labwork today, you will receive an invoice from [facility]. Please contact [facility] at [phone] with questions or concerns regarding your invoice.   Our billing staff will not be able to assist you with questions regarding bills from these companies.  You will be contacted with the lab results as soon as they are available. The fastest way to get your results is to activate your My Chart account. Instructions are located on the last page of this paperwork. If you have not heard from [place] regarding the results in 2 weeks, please contact this office.     Earache, Adult An earache, or ear pain, can be caused by many things, including:  An infection.  Ear wax buildup.  Ear pressure.  Something in the ear that should not be there (foreign body).  A sore throat.  Tooth problems.  Jaw problems. Treatment of the earache will depend on the cause. If the cause is not clear or cannot be determined, you may need to watch your symptoms until your earache goes away or until a cause is found. Follow these instructions at home: Medicines  Take or apply over-the-counter and prescription medicines only as told by your health care provider.  If you were prescribed an antibiotic medicine, use it as told by your health care provider. Do not stop using the antibiotic even if you start to feel better.  Do not put anything in your ear other than medicine that is prescribed by your health care provider. Managing pain If directed, apply heat to the  affected area as often as told by your health care provider. Use the heat source that your health care provider recommends, such as a moist heat pack or a heating pad.  Place a towel between your skin and the heat source.  Leave the heat on for 20-30 minutes.  Remove the heat if your skin turns bright red. This is especially important if you are unable to feel pain, heat, or cold. You may have a greater risk of getting burned. If directed, put ice on the affected area as often as told by your health care provider. To do this:      Put ice in a plastic bag.  Place a towel between your skin and the bag.  Leave the ice on for 20 minutes, 2-3 times a day. General instructions  Pay attention to any changes in your symptoms.  Try resting in an upright position instead of lying down. This may help to reduce pressure in your ear and relieve pain.  Chew gum if it helps to relieve your ear pain.  Treat any allergies as told by your health care provider.  Drink enough fluid to keep your urine pale yellow.  It is up to you to get the results of any tests that were done. Ask your health care provider, or the department that is doing the tests, when your results will be ready.  Keep all follow-up visits as told by your health care provider. This is important. Contact a health care provider if:  Your pain does not improve within 2 days.  Your earache gets worse.  You have new symptoms.  You have a fever. Get help right away if you:  Have  a severe headache.  Have a stiff neck.  Have trouble swallowing.  Have redness or swelling behind your ear.  Have fluid or blood coming from your ear.  Have hearing loss.  Feel dizzy. Summary  An earache, or ear pain, can be caused by many things.  Treatment of the earache will depend on the cause. Follow recommendations from your health care provider to treat your ear pain.  If the cause is not clear or cannot be determined, you may  need to watch your symptoms until your earache goes away or until a cause is found.  Keep all follow-up visits as told by your health care provider. This is important. This information is not intended to replace advice given to you by your health care provider. Make sure you discuss any questions you have with your health care provider. Document Revised: 02/28/2019 Document Reviewed: 02/28/2019 Elsevier Patient Education  2020 Elsevier Inc.      Edwina Barth, MD [facility]

## 2020-03-31 ENCOUNTER — Other Ambulatory Visit: Payer: Self-pay

## 2020-03-31 ENCOUNTER — Telehealth (INDEPENDENT_AMBULATORY_CARE_PROVIDER_SITE_OTHER): Payer: 59 | Admitting: Family Medicine

## 2020-03-31 DIAGNOSIS — F5104 Psychophysiologic insomnia: Secondary | ICD-10-CM

## 2020-03-31 DIAGNOSIS — F4321 Adjustment disorder with depressed mood: Secondary | ICD-10-CM

## 2020-03-31 MED ORDER — QUETIAPINE FUMARATE 25 MG PO TABS
25.0000 mg | ORAL_TABLET | Freq: Every evening | ORAL | 2 refills | Status: DC | PRN
Start: 2020-03-31 — End: 2021-06-23

## 2020-03-31 NOTE — Progress Notes (Signed)
Virtual Visit Note  I connected with patient on 03/31/20 at 551pm by phone (patient not answering video call) and verified that I am speaking with the correct person using two identifiers. Tiffany Singleton is currently located at home and patient is currently with them during visit. The provider, Myles Lipps, MD is located in their office at time of visit.  I discussed the limitations, risks, security and privacy concerns of performing an evaluation and management service by telephone and the availability of in person appointments. I also discussed with the patient that there may be a patient responsible charge related to this service. The patient expressed understanding and agreed to proceed.   I provided 7 minutes of non-face-to-face time during this encounter.  Chief Complaint  Patient presents with  . Follow-up    pt is no longer having urinary issues, still having trouble sleeping. Does not really feel like the seroquel is working for the grief. Wants to talk about the way she is taking the med. Takes at night between hours of 9-11pm. Gad 7 form completed. Score 21    HPI ? Last OV a week ago - started seroquel Has been taking seroquel 25mg  - which helps for fall sleep She has not been taking every night Denies any side effects Starts grief counseling sept 7th (death of mother) She is doing better    Allergies  Allergen Reactions  . Hydrocodone Hives and Itching    Prior to Admission medications   Medication Sig Start Date End Date Taking? Authorizing Provider  acetaminophen (TYLENOL) 325 MG tablet Take 650 mg by mouth every 6 (six) hours as needed.     Yes [provider]  acetic acid-hydrocortisone (VOSOL-HC) OTIC solution Place 3 drops into the left ear 3 (three) times daily. 03/29/20  Yes 03/31/20, MD  azithromycin [facility]) 250 MG tablet Sig as indicated 03/29/20  Yes Sagardia, 03/31/20, MD  cephALEXin (KEFLEX) 500 MG capsule Take 1  capsule (500 mg total) by mouth 2 (two) times daily. 04/08/19  Yes 06/08/19, MD  etonogestrel (NEXPLANON) 68 MG IMPL implant Nexplanon 68 mg subdermal implant  Inject 1 implant every day by subcutaneous route.   Yes [provider]  QUEtiapine (SEROQUEL) 25 MG tablet Take 0.5 tablets (12.5 mg total) by mouth at bedtime as needed (insmonia, anxiety). 03/24/20  Yes 03/26/20, MD    Past Medical History:  Diagnosis Date  . Family history of breast cancer   . Family history of lung cancer   . Family history of ovarian cancer   . Family history of prostate cancer     Past Surgical History:  Procedure Laterality Date  . LYMPH GLAND EXCISION    . WISDOM TOOTH EXTRACTION      Social History   Tobacco Use  . Smoking status: Never Smoker  . Smokeless tobacco: Never Used  Substance Use Topics  . Alcohol use: Yes    Comment: occ    Family History  Problem Relation Age of Onset  . Lung cancer Mother 72  . Ovarian cancer Mother 73  . Brain cancer Mother   . Hypertension Father   . Prostate cancer Father   . Breast cancer Paternal Grandmother        dx. in her 47s  . Ovarian cancer Paternal Grandmother        dx. in her late 54s    ROS Per hpi   Objective  Vitals as reported by the  patient:   ASSESSMENT and PLAN  1. Grief 2. Psychophysiological insomnia Cont with seroquel. Start counseling. RTC precautions given.   Other orders - QUEtiapine (SEROQUEL) 25 MG tablet; Take 1 tablet (25 mg total) by mouth at bedtime as needed (insmonia, anxiety).  FOLLOW-UP: 4 weeks   The above assessment and management plan was discussed with the patient. The patient verbalized understanding of and has agreed to the management plan. Patient is aware to call the clinic if symptoms persist or worsen. Patient is aware when to return to the clinic for a follow-up visit. Patient educated on when it is appropriate to go to the emergency department.     Myles Lipps,  MD [facility] [address] [place], [place] [address] Ph.  [phone] Fax [phone]

## 2020-04-28 ENCOUNTER — Ambulatory Visit: Payer: 59 | Admitting: Family Medicine

## 2020-04-29 ENCOUNTER — Encounter: Payer: Self-pay | Admitting: Family Medicine

## 2021-01-04 ENCOUNTER — Telehealth: Payer: Self-pay

## 2021-01-04 NOTE — Telephone Encounter (Signed)
Patient is calling and wanted to see if provider wold accept her as a new patient, please advise. CB is [phone]

## 2021-06-23 ENCOUNTER — Other Ambulatory Visit: Payer: Self-pay

## 2021-06-23 ENCOUNTER — Ambulatory Visit
Admission: EM | Admit: 2021-06-23 | Discharge: 2021-06-23 | Disposition: A | Discharge status: Home / Self Care | Payer: 59 | Attending: Emergency Medicine | Admitting: Emergency Medicine

## 2021-06-23 DIAGNOSIS — J398 Other specified diseases of upper respiratory tract: Secondary | ICD-10-CM

## 2021-06-23 DIAGNOSIS — R059 Cough, unspecified: Secondary | ICD-10-CM

## 2021-06-23 DIAGNOSIS — J069 Acute upper respiratory infection, unspecified: Secondary | ICD-10-CM

## 2021-06-23 LAB — POCT INFLUENZA A/B
Influenza A, POC: NEGATIVE
Influenza B, POC: NEGATIVE

## 2021-06-23 MED ORDER — METHYLPREDNISOLONE 4 MG PO TBPK: ORAL_TABLET | ORAL | 0 refills | Status: DC

## 2021-06-23 MED ORDER — AZITHROMYCIN 250 MG PO TABS: 250.0000 mg | ORAL_TABLET | Freq: Every day | ORAL | 0 refills | Status: DC

## 2021-06-23 MED ORDER — GUAIFENESIN 400 MG PO TABS: 400.0000 mg | ORAL_TABLET | Freq: Four times a day (QID) | ORAL | 0 refills | Status: AC | PRN

## 2021-06-23 MED ORDER — METHYLPREDNISOLONE SODIUM SUCC 125 MG IJ SOLR
80.0000 mg | Freq: Once | INTRAMUSCULAR | Status: AC
  Administered 2021-06-23: 80 mg via INTRAMUSCULAR

## 2021-06-23 NOTE — Discharge Instructions (Signed)
Your influenza test today is negative.  You have congestion in your upper respiratory tract that you need help clearing.  I recommend that you continue taking guaifenesin, I provided you with a prescription of 400 mg tablets that you can take once every 6 hours as needed for 5 days.  You were provided with an injection of methylprednisolone in the office today, this is significantly reduce the inflammation in your upper airways, opening up the airways and allowing you to cough more efficiently and remove the detritus left over from your respiratory infection.  I recommend that you follow-up this steroid injection with oral steroid, also methylprednisolone, this is provided to you in a Dosepak by prescription, please take 1 row of tablets daily until you are either feeling 100% better or the pack is complete.  Finally, I recommend that you take azithromycin, 2 tablets today and 1 tablet daily thereafter.  You should finish this entire treatment.  It will eliminate the atypical bacteria that can aggravate and prolong viral upper respiratory infections.  You were not wheezing on exam today, I am confident that you should be feeling significantly better before Monday.  I provided you with a note for work.

## 2021-06-23 NOTE — ED Triage Notes (Addendum)
Pt states she has had a cough. At home covid test was negative on Sunday.  Started: about 5 days ago

## 2021-06-23 NOTE — ED Provider Notes (Signed)
[facility]    CSN: [id number] Arrival date & time: 06/23/21  0933   History   Chief Complaint Chief Complaint  Patient presents with   Cough   HPI Tiffany Singleton is a 40 y.o. female. Patient states that 5 days ago she had what felt like a viral illness, home COVID test was negative.  Patient states she has been struggling with cough and congestion, feels like her chest is tight and she cannot move the congestion up and out.  Patient states she began taking Robitussin yesterday, states that this morning she was able to cough up a small amount of yellow mucus.  Patient states she has been taking some ibuprofen and Tylenol as well mostly for body aches and chills, states this helps some.  Patient denies nausea, vomiting, diarrhea, headache, sore throat.  The history is provided by the patient.   Past Medical History:  Diagnosis Date   Family history of breast cancer    Family history of lung cancer    Family history of ovarian cancer    Family history of prostate cancer    There are no problems to display for this patient.  Past Surgical History:  Procedure Laterality Date   LYMPH GLAND EXCISION     WISDOM TOOTH EXTRACTION     OB History   No obstetric history on file.    Home Medications    Prior to Admission medications   Medication Sig Start Date End Date Taking? Authorizing Provider  azithromycin (ZITHROMAX) 250 MG tablet Take 1 tablet (250 mg total) by mouth daily. Take first 2 tablets together, then 1 every day until finished. 06/23/21  Yes Theadora Rama Scales, PA-C  guaifenesin (HUMIBID E) 400 MG TABS tablet Take 1 tablet (400 mg total) by mouth every 6 (six) hours as needed for up to 5 days. 06/23/21 06/28/21 Yes Theadora Rama Scales, PA-C  methylPREDNISolone (MEDROL DOSEPAK) 4 MG TBPK tablet Take 24 mg on day 1, 20 mg on day 2, 16 mg on day 3, 12 mg on day 4, 8 mg on day 5, 4 mg on day 6. 06/23/21  Yes Theadora Rama Scales, PA-C  acetaminophen  (TYLENOL) 325 MG tablet Take 650 mg by mouth every 6 (six) hours as needed.      [provider]  etonogestrel (NEXPLANON) 68 MG IMPL implant Nexplanon 68 mg subdermal implant  Inject 1 implant every day by subcutaneous route.    [provider]   Family History Family History  Problem Relation Age of Onset   Lung cancer Mother 24   Ovarian cancer Mother 49   Brain cancer Mother    Hypertension Father    Prostate cancer Father    Breast cancer Paternal Grandmother        dx. in her 40s   Ovarian cancer Paternal Grandmother        dx. in her late 59s   Social History Social History   Tobacco Use   Smoking status: Never   Smokeless tobacco: Never  Vaping Use   Vaping Use: Never used  Substance Use Topics   Alcohol use: Yes    Comment: occ   Drug use: No   Allergies   Hydrocodone  Review of Systems Review of Systems Pertinent findings noted in history of present illness.   Physical Exam Triage Vital Signs ED Triage Vitals  Enc Vitals Group     BP 06/02/21 0827 (!) 147/82     Pulse Rate 06/02/21 0827  72     Resp 06/02/21 0827 18     Temp 06/02/21 0827 98.3 F (36.8 C)     Temp Source 06/02/21 0827 Oral     SpO2 06/02/21 0827 98 %     Weight --      Height --      Head Circumference --      Peak Flow --      Pain Score 06/02/21 0826 5     Pain Loc --      Pain Edu? --      Excl. in [place]? --    No data found.  Updated Vital Signs BP 105/68 (BP Location: Right Arm)   Pulse 67   Temp 98 F (36.7 C) (Oral)   Resp 18   LMP 06/20/2021   SpO2 96%   Visual Acuity Right Eye Distance:   Left Eye Distance:   Bilateral Distance:    Right Eye Near:   Left Eye Near:    Bilateral Near:     Physical Exam Vitals and nursing note reviewed.  Constitutional:      General: She is not in acute distress.    Appearance: Normal appearance. She is not ill-appearing.  HENT:     Head: Normocephalic and atraumatic.     Salivary Glands: Right salivary  gland is not diffusely enlarged or tender. Left salivary gland is not diffusely enlarged or tender.     Right Ear: Tympanic membrane, ear canal and external ear normal. No drainage. No middle ear effusion. There is no impacted cerumen. Tympanic membrane is not erythematous or bulging.     Left Ear: Tympanic membrane, ear canal and external ear normal. No drainage.  No middle ear effusion. There is no impacted cerumen. Tympanic membrane is not erythematous or bulging.     Nose: Nose normal. No nasal deformity, septal deviation, mucosal edema, congestion or rhinorrhea.     Right Turbinates: Not enlarged, swollen or pale.     Left Turbinates: Not enlarged, swollen or pale.     Right Sinus: No maxillary sinus tenderness or frontal sinus tenderness.     Left Sinus: No maxillary sinus tenderness or frontal sinus tenderness.     Mouth/Throat:     Lips: Pink. No lesions.     Mouth: Mucous membranes are moist. No oral lesions.     Pharynx: Oropharynx is clear. Uvula midline. No posterior oropharyngeal erythema or uvula swelling.     Tonsils: No tonsillar exudate. 0 on the right. 0 on the left.  Eyes:     General: Lids are normal.        Right eye: No discharge.        Left eye: No discharge.     Extraocular Movements: Extraocular movements intact.     Conjunctiva/sclera: Conjunctivae normal.     Right eye: Right conjunctiva is not injected.     Left eye: Left conjunctiva is not injected.  Neck:     Trachea: Trachea and phonation normal.  Cardiovascular:     Rate and Rhythm: Normal rate and regular rhythm.     Pulses: Normal pulses.     Heart sounds: Normal heart sounds. No murmur heard.   No friction rub. No gallop.  Pulmonary:     Effort: Pulmonary effort is normal. No accessory muscle usage, prolonged expiration or respiratory distress.     Breath sounds: Normal breath sounds. No stridor, decreased air movement or transmitted upper airway sounds. No decreased breath sounds, wheezing, rhonchi  or rales.  Chest:     Chest wall: No tenderness.  Musculoskeletal:        General: Normal range of motion.     Cervical back: Normal range of motion and neck supple. Normal range of motion.  Lymphadenopathy:     Cervical: No cervical adenopathy.  Skin:    General: Skin is warm and dry.     Findings: No erythema or rash.  Neurological:     General: No focal deficit present.     Mental Status: She is alert and oriented to person, place, and time.  Psychiatric:        Mood and Affect: Mood normal.        Behavior: Behavior normal.   UC Treatments / Results  Labs (all labs ordered are listed, but only abnormal results are displayed)  Labs Reviewed  POCT INFLUENZA A/B    EKG  Radiology No results found.  Procedures Procedures (including critical care time)  Medications Ordered in UC Medications  methylPREDNISolone sodium succinate (SOLU-MEDROL) 125 mg/2 mL injection 80 mg (80 mg Intramuscular Given 06/23/21 1200)    Initial Impression / Assessment and Plan / UC Course  I have reviewed the triage vital signs and the nursing notes.  Pertinent labs & imaging results that were available during my care of the patient were reviewed by me and considered in my medical decision making (see chart for details).      Physical exam today is unremarkable.  Given patient's duration of symptoms and complaints of inability to expectorate, I recommend that she continue taking guaifenesin.  Because patient is requesting expediting her recovery from her upper respiratory infection, I provided her with a tapering dose of steroid and Z-Pak.  Patient advised that the Z-Pak likely will only provide modest relief but will also help prophylax for pneumonia given the congestion she has in her chest.  Return precautions advised.  Influenza test today is negative.  Final Clinical Impressions(s) / UC Diagnoses   Final diagnoses:  Cough, unspecified type  Acute upper respiratory infection  Congestion  of upper respiratory tract     Discharge Instructions      Your influenza test today is negative.  You have congestion in your upper respiratory tract that you need help clearing.  I recommend that you continue taking guaifenesin, I provided you with a prescription of 400 mg tablets that you can take once every 6 hours as needed for 5 days.  You were provided with an injection of methylprednisolone in the office today, this is significantly reduce the inflammation in your upper airways, opening up the airways and allowing you to cough more efficiently and remove the detritus left over from your respiratory infection.  I recommend that you follow-up this steroid injection with oral steroid, also methylprednisolone, this is provided to you in a Dosepak by prescription, please take 1 row of tablets daily until you are either feeling 100% better or the pack is complete.  Finally, I recommend that you take azithromycin, 2 tablets today and 1 tablet daily thereafter.  You should finish this entire treatment.  It will eliminate the atypical bacteria that can aggravate and prolong viral upper respiratory infections.  You were not wheezing on exam today, I am confident that you should be feeling significantly better before Monday.  I provided you with a note for work.     ED Prescriptions     Medication Sig Dispense Auth. Provider   guaifenesin (HUMIBID E) 400 MG TABS  tablet Take 1 tablet (400 mg total) by mouth every 6 (six) hours as needed for up to 5 days. 20 tablet Theadora Rama Scales, PA-C   azithromycin (ZITHROMAX) 250 MG tablet Take 1 tablet (250 mg total) by mouth daily. Take first 2 tablets together, then 1 every day until finished. 6 tablet Theadora Rama Scales, PA-C   methylPREDNISolone (MEDROL DOSEPAK) 4 MG TBPK tablet Take 24 mg on day 1, 20 mg on day 2, 16 mg on day 3, 12 mg on day 4, 8 mg on day 5, 4 mg on day 6. 21 tablet Theadora Rama Scales, PA-C      PDMP not reviewed  this encounter.  Disposition Upon Discharge:  Patient presented with an acute illness with associated systemic symptoms and significant discomfort requiring urgent management. In my opinion, this is a condition that a prudent lay person (someone who possesses an average knowledge of health and medicine) may potentially expect to result in complications if not addressed urgently such as respiratory distress, impairment of bodily function or dysfunction of bodily organs.   Routine symptom specific, illness specific and/or disease specific instructions were discussed with the patient and/or caregiver at length.   As such, the patient has been evaluated and assessed, work-up was performed and treatment was provided in alignment with urgent care protocols and evidence based medicine.  Patient/parent/caregiver has been advised that the patient may require follow up for further testing and treatment if the symptoms continue in spite of treatment, as clinically indicated and appropriate. Patient/parent/caregiver has been advised to return to the [facility] or PCP in 3-5 days if no better; to PCP or the Emergency Department if new signs and symptoms develop, or if the current signs or symptoms continue to change or worsen for further workup, evaluation and treatment as clinically indicated and appropriate  The patient will follow up with their current PCP if and as advised. If the patient does not currently have a PCP we will assist them in obtaining one.   Patient/parent/caregiver verbalized understanding and agreement of plan as discussed.  All questions were addressed during visit.  Please see discharge instructions below for further details of plan.  Condition: stable for discharge home Home: take medications as prescribed; routine discharge instructions as discussed; follow up as advised.    Theadora Rama Scales, PA-C 06/23/21 1318

## 2021-08-19 ENCOUNTER — Other Ambulatory Visit: Payer: Self-pay | Admitting: Obstetrics and Gynecology

## 2021-08-19 DIAGNOSIS — R928 Other abnormal and inconclusive findings on diagnostic imaging of breast: Secondary | ICD-10-CM

## 2021-08-31 ENCOUNTER — Ambulatory Visit
Admission: RE | Admit: 2021-08-31 | Discharge: 2021-08-31 | Disposition: A | Discharge status: Home / Self Care | Payer: Self-pay | Source: Ambulatory Visit | Attending: Obstetrics and Gynecology | Admitting: Obstetrics and Gynecology

## 2021-08-31 ENCOUNTER — Other Ambulatory Visit: Payer: Self-pay | Admitting: Obstetrics and Gynecology

## 2021-08-31 ENCOUNTER — Ambulatory Visit
Admission: RE | Admit: 2021-08-31 | Discharge: 2021-08-31 | Disposition: A | Discharge status: Home / Self Care | Payer: 59 | Source: Ambulatory Visit | Attending: Obstetrics and Gynecology | Admitting: Obstetrics and Gynecology

## 2021-08-31 DIAGNOSIS — N6320 Unspecified lump in the left breast, unspecified quadrant: Secondary | ICD-10-CM

## 2021-08-31 DIAGNOSIS — R928 Other abnormal and inconclusive findings on diagnostic imaging of breast: Secondary | ICD-10-CM

## 2021-09-13 ENCOUNTER — Other Ambulatory Visit: Payer: Self-pay

## 2022-03-01 ENCOUNTER — Other Ambulatory Visit: Payer: 59

## 2022-05-03 ENCOUNTER — Ambulatory Visit
Admission: EM | Admit: 2022-05-03 | Discharge: 2022-05-03 | Disposition: A | Discharge status: Home / Self Care | Payer: BC Managed Care – PPO | Attending: Urgent Care | Admitting: Urgent Care

## 2022-05-03 DIAGNOSIS — J069 Acute upper respiratory infection, unspecified: Secondary | ICD-10-CM | POA: Insufficient documentation

## 2022-05-03 DIAGNOSIS — K122 Cellulitis and abscess of mouth: Secondary | ICD-10-CM | POA: Diagnosis not present

## 2022-05-03 LAB — POCT RAPID STREP A (OFFICE): Rapid Strep A Screen: NEGATIVE

## 2022-05-03 MED ORDER — DELSYM NIGHTTIME COUGH MAX STR 20-650-2.5 MG/20ML PO SOLN: 20.0000 mL | Freq: Every evening | ORAL | 0 refills | Status: DC | PRN

## 2022-05-03 MED ORDER — LIDOCAINE VISCOUS HCL 2 % MT SOLN: 5.0000 mL | Freq: Four times a day (QID) | OROMUCOSAL | 0 refills | Status: DC | PRN

## 2022-05-03 MED ORDER — MONTELUKAST SODIUM 10 MG PO TABS
10.0000 mg | ORAL_TABLET | Freq: Every day | ORAL | 0 refills | Status: DC
Start: 2022-05-03 — End: 2022-10-19

## 2022-05-03 NOTE — Discharge Instructions (Addendum)
Your rapid strep is negative.  We will send out a throat culture for confirmation.  If throat culture positive, will initiate antibiotics.  You have a condition called uvulitis, which is swelling of the uvula, with small ulcerations on it.  This causes painful swallowing.  This is viral.  Please use the Magic mouthwash as prescribed.  You may also use Cepacol lozenges, Chloraseptic throat spray, salt water gargles.  For the cough, I have prescribed a medication for you to take.  During the day I would recommend plain Mucinex to help bring up any phlegm.  Take the cough syrup only at nighttime.  Please use montelukast at night as well.  A warm mist vaporizer over your bed at night may also be beneficial.

## 2022-05-03 NOTE — ED Triage Notes (Signed)
Patient presents to UC for sore throat, cough and nasal congestion since 2 days ago. Treating with one dose of alka-seltzer and ibuprofen.   Denies fever.

## 2022-05-03 NOTE — ED Provider Notes (Signed)
[facility]    CSN: [id number] Arrival date & time: 05/03/22  0900      History   Chief Complaint Chief Complaint  Patient presents with   Sore Throat   Nasal Congestion    HPI Tiffany Singleton is a 42 y.o. female.   Pleasant 41 year old female with no contributory past medical history presents today due to concerns of sore throat, nasal congestion, dry cough for the past 2 days.  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  Reports a handful of them are ill.  Her primary concern is her sore throat.  She has been taking over-the-counter Alka-Seltzer without symptom resolution.  Patient is concerned because she felt a wheezing.  States significant exertion causes shortness of breath.  She has no cardiac or pulmonary history.  Denies orthopnea.  No colored sputum.  No fever, vomiting, GI symptoms, or additional URI symptoms. Pt does not smoke.   Sore Throat    Past Medical History:  Diagnosis Date   Family history of breast cancer    Family history of lung cancer    Family history of ovarian cancer    Family history of prostate cancer     There are no problems to display for this patient.   Past Surgical History:  Procedure Laterality Date   LYMPH GLAND EXCISION     WISDOM TOOTH EXTRACTION      OB History   No obstetric history on file.      Home Medications    Prior to Admission medications   Medication Sig Start Date End Date Taking? Authorizing Provider  DM-Acetaminophen-Triprolidine (DELSYM NIGHTTIME COUGH MAX STR) 20-650-2.5 MG/20ML SOLN Take 20 mLs by mouth at bedtime as needed. 05/03/22  Yes Ahyana Skillin L, PA  magic mouthwash (lidocaine, diphenhydrAMINE, alum & mag hydroxide) suspension Swish and spit 5 mLs 4 (four) times daily as needed for mouth pain. 05/03/22  Yes Bastian Andreoli L, PA  montelukast (SINGULAIR) 10 MG tablet Take 1 tablet (10 mg total) by mouth at bedtime. 05/03/22  Yes Magdaline Zollars L, PA  etonogestrel  (NEXPLANON) 68 MG IMPL implant Nexplanon 68 mg subdermal implant  Inject 1 implant every day by subcutaneous route.    [provider]    Family History Family History  Problem Relation Age of Onset   Lung cancer Mother 14   Ovarian cancer Mother 14   Brain cancer Mother    Hypertension Father    Prostate cancer Father    Breast cancer Paternal Grandmother        dx. in her 70s   Ovarian cancer Paternal Grandmother        dx. in her late 20s    Social History Social History   Tobacco Use   Smoking status: Never   Smokeless tobacco: Never  Vaping Use   Vaping Use: Never used  Substance Use Topics   Alcohol use: Yes    Comment: occ   Drug use: No     Allergies   Hydrocodone   Review of Systems Review of Systems As per HPI  Physical Exam Triage Vital Signs ED Triage Vitals  Enc Vitals Group     BP 05/03/22 0914 108/73     Pulse Rate 05/03/22 0914 76     Resp 05/03/22 0914 16     Temp 05/03/22 0914 98.3 F (36.8 C)     Temp Source 05/03/22 0914 Oral     SpO2 05/03/22 0914 97 %  Weight --      Height --      Head Circumference --      Peak Flow --      Pain Score 05/03/22 0913 10     Pain Loc --      Pain Edu? --      Excl. in [place]? --    No data found.  Updated Vital Signs BP 108/73 (BP Location: Left Arm)   Pulse 76   Temp 98.3 F (36.8 C) (Oral)   Resp 16   SpO2 97%   Visual Acuity Right Eye Distance:   Left Eye Distance:   Bilateral Distance:    Right Eye Near:   Left Eye Near:    Bilateral Near:     Physical Exam Vitals and nursing note reviewed.  Constitutional:      General: She is not in acute distress.    Appearance: She is well-developed. She is not ill-appearing, toxic-appearing or diaphoretic.  HENT:     Head: Normocephalic and atraumatic.     Right Ear: Tympanic membrane and ear canal normal. No drainage, swelling or tenderness. No middle ear effusion. Tympanic membrane is not erythematous.     Left Ear:  Tympanic membrane and ear canal normal. No drainage, swelling or tenderness.  No middle ear effusion. Tympanic membrane is not erythematous.     Nose: Congestion present.     Mouth/Throat:     Mouth: Mucous membranes are moist. Oral lesions (2 ulcerations to uvula) present.     Pharynx: Oropharynx is clear. Uvula midline. Uvula swelling present. No pharyngeal swelling, oropharyngeal exudate or posterior oropharyngeal erythema.     Tonsils: No tonsillar exudate or tonsillar abscesses.  Eyes:     Conjunctiva/sclera: Conjunctivae normal.  Neck:     Thyroid: No thyromegaly.  Cardiovascular:     Rate and Rhythm: Normal rate and regular rhythm.     Heart sounds: No murmur heard. Pulmonary:     Effort: Pulmonary effort is normal. No respiratory distress.     Breath sounds: Normal breath sounds. No stridor. No wheezing, rhonchi or rales.  Chest:     Chest wall: No tenderness.  Abdominal:     Palpations: Abdomen is soft.     Tenderness: There is no abdominal tenderness.  Musculoskeletal:        General: No swelling.     Cervical back: Normal range of motion and neck supple.  Lymphadenopathy:     Cervical: No cervical adenopathy.  Skin:    General: Skin is warm and dry.     Capillary Refill: Capillary refill takes less than 2 seconds.     Findings: No erythema or rash.  Neurological:     General: No focal deficit present.     Mental Status: She is alert and oriented to person, place, and time.  Psychiatric:        Mood and Affect: Mood normal.      UC Treatments / Results  Labs (all labs ordered are listed, but only abnormal results are displayed) Labs Reviewed  CULTURE, GROUP A STREP [facility])  POCT RAPID STREP A (OFFICE)    EKG   Radiology No results found.  Procedures Procedures (including critical care time)  Medications Ordered in UC Medications - No data to display  Initial Impression / Assessment and Plan / UC Course  I have reviewed the triage vital signs and  the nursing notes.  Pertinent labs & imaging results that were available during my care of  the patient were reviewed by me and considered in my medical decision making (see chart for details).     Uvulitis -rapid strep negative, sore throat appears to be stemming from viral uvulitis with ulcerations.  We will do symptomatic treatment with Magic mouthwash, additional over-the-counter medications discussed with patient.  Supportive measures appropriate. URI with cough and congestion -recommended humidification, saline sprays.  We will add antihistamines over-the-counter and Rx montelukast.  Delsym as prescribed for nighttime use only. RTC precautions discussed.   Final Clinical Impressions(s) / UC Diagnoses   Final diagnoses:  Uvulitis  URI with cough and congestion     Discharge Instructions      Your rapid strep is negative.  We will send out a throat culture for confirmation.  If throat culture positive, will initiate antibiotics.  You have a condition called uvulitis, which is swelling of the uvula, with small ulcerations on it.  This causes painful swallowing.  This is viral.  Please use the Magic mouthwash as prescribed.  You may also use Cepacol lozenges, Chloraseptic throat spray, salt water gargles.  For the cough, I have prescribed a medication for you to take.  During the day I would recommend plain Mucinex to help bring up any phlegm.  Take the cough syrup only at nighttime.  Please use montelukast at night as well.  A warm mist vaporizer over your bed at night may also be beneficial.    ED Prescriptions     Medication Sig Dispense Auth. Provider   montelukast (SINGULAIR) 10 MG tablet Take 1 tablet (10 mg total) by mouth at bedtime. 14 tablet Dasja Brase L, PA   magic mouthwash (lidocaine, diphenhydrAMINE, alum & mag hydroxide) suspension Swish and spit 5 mLs 4 (four) times daily as needed for mouth pain. 180 mL Travers Goodley L, PA   DM-Acetaminophen-Triprolidine  (DELSYM NIGHTTIME COUGH MAX STR) 20-650-2.5 MG/20ML SOLN Take 20 mLs by mouth at bedtime as needed. 118 mL Cyncere Ruhe L, PA      PDMP not reviewed this encounter.   Maretta Bees, [place] 05/03/22 [phone]

## 2022-05-06 LAB — CULTURE, GROUP A STREP (THRC)

## 2022-10-19 ENCOUNTER — Encounter: Payer: Self-pay | Admitting: Family

## 2022-10-19 ENCOUNTER — Ambulatory Visit: Payer: 59 | Admitting: Family

## 2022-10-19 ENCOUNTER — Other Ambulatory Visit: Payer: Self-pay | Admitting: Family

## 2022-10-19 VITALS — BP 124/64 | HR 75 | Resp 18 | Ht 62.0 in | Wt 166.8 lb

## 2022-10-19 DIAGNOSIS — E559 Vitamin D deficiency, unspecified: Secondary | ICD-10-CM

## 2022-10-19 DIAGNOSIS — Z13220 Encounter for screening for lipoid disorders: Secondary | ICD-10-CM | POA: Diagnosis not present

## 2022-10-19 DIAGNOSIS — R7309 Other abnormal glucose: Secondary | ICD-10-CM | POA: Diagnosis not present

## 2022-10-19 DIAGNOSIS — R5383 Other fatigue: Secondary | ICD-10-CM

## 2022-10-19 DIAGNOSIS — G43809 Other migraine, not intractable, without status migrainosus: Secondary | ICD-10-CM

## 2022-10-19 LAB — CBC WITH DIFFERENTIAL/PLATELET
Basophils Absolute: 0.1 10*3/uL (ref 0.0–0.1)
Basophils Relative: 0.8 % (ref 0.0–3.0)
Eosinophils Absolute: 0.1 10*3/uL (ref 0.0–0.7)
Eosinophils Relative: 1.2 % (ref 0.0–5.0)
HCT: 41 % (ref 36.0–46.0)
Hemoglobin: 13.7 g/dL (ref 12.0–15.0)
Lymphocytes Relative: 38 % (ref 12.0–46.0)
Lymphs Abs: 3.2 10*3/uL (ref 0.7–4.0)
MCHC: 33.3 g/dL (ref 30.0–36.0)
MCV: 90.7 fl (ref 78.0–100.0)
Monocytes Absolute: 0.7 10*3/uL (ref 0.1–1.0)
Monocytes Relative: 7.8 % (ref 3.0–12.0)
Neutro Abs: 4.4 10*3/uL (ref 1.4–7.7)
Neutrophils Relative %: 52.2 % (ref 43.0–77.0)
Platelets: 153 10*3/uL (ref 150.0–400.0)
RBC: 4.52 Mil/uL (ref 3.87–5.11)
RDW: 13.3 % (ref 11.5–15.5)
WBC: 8.5 10*3/uL (ref 4.0–10.5)

## 2022-10-19 LAB — LIPID PANEL
Cholesterol: 169 mg/dL (ref 0–200)
HDL: 55.8 mg/dL (ref >39.00)
LDL Cholesterol: 105 mg/dL — ABNORMAL HIGH (ref 0–99)
NonHDL: 113.41
Total CHOL/HDL Ratio: 3
Triglycerides: 44 mg/dL (ref 0.0–149.0)
VLDL: 8.8 mg/dL (ref 0.0–40.0)

## 2022-10-19 LAB — TSH: TSH: 0.77 u[IU]/mL (ref 0.35–5.50)

## 2022-10-19 LAB — HEMOGLOBIN A1C: Hgb A1c MFr Bld: 5.3 % (ref 4.6–6.5)

## 2022-10-19 LAB — COMPREHENSIVE METABOLIC PANEL
ALT: 14 U/L (ref 0–35)
AST: 15 U/L (ref 0–37)
Albumin: 4.1 g/dL (ref 3.5–5.2)
Alkaline Phosphatase: 60 U/L (ref 39–117)
BUN: 7 mg/dL (ref 6–23)
CO2: 29 mEq/L (ref 19–32)
Calcium: 9.5 mg/dL (ref 8.4–10.5)
Chloride: 102 mEq/L (ref 96–112)
Creatinine, Ser: 0.81 mg/dL (ref 0.40–1.20)
GFR: 89.06 mL/min (ref >60.00)
Glucose, Bld: 86 mg/dL (ref 70–99)
Potassium: 4.3 mEq/L (ref 3.5–5.1)
Sodium: 138 mEq/L (ref 135–145)
Total Bilirubin: 0.5 mg/dL (ref 0.2–1.2)
Total Protein: 7.1 g/dL (ref 6.0–8.3)

## 2022-10-19 LAB — VITAMIN B12: Vitamin B-12: 257 pg/mL (ref 211–911)

## 2022-10-19 LAB — VITAMIN D 25 HYDROXY (VIT D DEFICIENCY, FRACTURES): VITD: 16.43 ng/mL — ABNORMAL LOW (ref 30.00–100.00)

## 2022-10-19 MED ORDER — ESCITALOPRAM OXALATE 10 MG PO TABS: 10.0000 mg | ORAL_TABLET | Freq: Every day | ORAL | 1 refills | Status: DC

## 2022-10-19 MED ORDER — VITAMIN D (ERGOCALCIFEROL) 1.25 MG (50000 UNIT) PO CAPS: 50000.0000 [IU] | ORAL_CAPSULE | ORAL | 0 refills | Status: AC

## 2022-10-19 NOTE — Progress Notes (Signed)
Tiffany Singleton is a 42 y.o. female with the following history as recorded in EpicCare:  There are no problems to display for this patient.   Current Outpatient Medications  Medication Sig Dispense Refill   escitalopram (LEXAPRO) 10 MG tablet Take 1 tablet (10 mg total) by mouth daily. 30 tablet 1   etonogestrel (NEXPLANON) 68 MG IMPL implant Nexplanon 68 mg subdermal implant  Inject 1 implant every day by subcutaneous route.     No current facility-administered medications for this visit.    Allergies: Hydrocodone  Past Medical History:  Diagnosis Date   Family history of breast cancer    Family history of lung cancer    Family history of ovarian cancer    Family history of prostate cancer     Past Surgical History:  Procedure Laterality Date   LYMPH GLAND EXCISION     WISDOM TOOTH EXTRACTION      Family History  Problem Relation Age of Onset   Lung cancer Mother 86   Ovarian cancer Mother 60   Brain cancer Mother    Hypertension Father    Prostate cancer Father    Breast cancer Paternal Grandmother        dx. in her 71s   Ovarian cancer Paternal Grandmother        dx. in her late 16s    Social History   Tobacco Use   Smoking status: Never   Smokeless tobacco: Never  Substance Use Topics   Alcohol use: Yes    Comment: occ    Subjective:   Presents today as a new patient; strong FH of diabetes- sister diagnosed at age 55/ brother diagnosed in early 40s/ father has Type 2 Diabetes;   Lost her mother to ovarian/ metastatic brain cancer- ? Lung cancer; considering genetic counseling with GYN;  Has 2 sons- 35 yo son ( will be having first grandbaby soon)/ 54 yo son ( 7th grade);   Admits increased anxiety/ worsening migraines as well; has had migraines since she was in her 37s;      Objective:  Vitals:   10/19/22 1037  BP: 124/64  Pulse: 75  Resp: 18  SpO2: 100%  Weight: 166 lb 12.8 oz (75.7 kg)  Height: 5\' 2"  (1.575 m)    General: Well developed,  well nourished, in no acute distress  Skin : Warm and dry.  Head: Normocephalic and atraumatic  Eyes: Sclera and conjunctiva clear; pupils round and reactive to light; extraocular movements intact  Ears: External normal; canals clear; tympanic membranes normal  Oropharynx: Pink, supple. No suspicious lesions  Neck: Supple without thyromegaly, adenopathy  Lungs: Respirations unlabored; clear to auscultation bilaterally without wheeze, rales, rhonchi  CVS exam: normal rate and regular rhythm.  Abdomen: Soft; nontender; nondistended; normoactive bowel sounds; no masses or hepatosplenomegaly  Musculoskeletal: No deformities; no active joint inflammation  Extremities: No edema, cyanosis, clubbing  Vessels: Symmetric bilaterally  Neurologic: Alert and oriented; speech intact; face symmetrical; moves all extremities well; CNII-XII intact without focal deficit   Assessment:  1. Other fatigue   2. Elevated glucose   3. Lipid screening   4. Vitamin D deficiency   5. Other migraine without status migrainosus, not intractable     Plan:  Will update labs today; suspect anxiety is contributing to patient's worsening headaches; she will look into EAP and will start Lexapro- risks/ benefits of medication discussed; referral to neurologist to discuss headache management;  Plan to follow up in 1 month, sooner prn.  No follow-ups on file.  Orders Placed This Encounter  Procedures   CBC with Differential/Platelet   Comp Met (CMET)   Lipid panel   TSH   Hemoglobin A1c   B12   Vitamin D (25 hydroxy)   Ambulatory referral to Neurology    Referral Priority:   Routine    Referral Type:   Consultation    Referral Reason:   Specialty Services Required    Requested Specialty:   Neurology    Number of Visits Requested:   1    Requested Prescriptions   Signed Prescriptions Disp Refills   escitalopram (LEXAPRO) 10 MG tablet 30 tablet 1    Sig: Take 1 tablet (10 mg total) by mouth daily.

## 2022-10-19 NOTE — Patient Instructions (Signed)
Please reach out to your employer to discuss EAP options;

## 2022-11-10 ENCOUNTER — Other Ambulatory Visit: Payer: Self-pay | Admitting: Family

## 2022-11-19 ENCOUNTER — Telehealth: Payer: Self-pay

## 2022-11-19 NOTE — Telephone Encounter (Signed)
Pt called to reschedule her appt for tomorrow, 4/16. She also wanted me to send a message back regarding a potential side effect, since starting escitalopram she has been having worsening headaches. Didn't know if she should continue or stop. She can be reached at [phone].

## 2022-11-20 ENCOUNTER — Ambulatory Visit: Payer: 59 | Admitting: Family

## 2022-11-20 NOTE — Telephone Encounter (Signed)
Spoke with pt, pt stated she will go ahead and stop using rx. Pt states she would like to to discuss starting a new Rx at follow up appt on 12/04/2022

## 2022-12-04 ENCOUNTER — Ambulatory Visit: Payer: 59 | Admitting: Family

## 2022-12-14 ENCOUNTER — Encounter: Payer: Self-pay | Admitting: Family

## 2022-12-14 ENCOUNTER — Ambulatory Visit (INDEPENDENT_AMBULATORY_CARE_PROVIDER_SITE_OTHER): Payer: 59 | Admitting: Family

## 2022-12-14 VITALS — BP 122/74 | HR 61 | Ht 62.0 in | Wt 163.0 lb

## 2022-12-14 DIAGNOSIS — R5383 Other fatigue: Secondary | ICD-10-CM | POA: Diagnosis not present

## 2022-12-14 DIAGNOSIS — E559 Vitamin D deficiency, unspecified: Secondary | ICD-10-CM | POA: Diagnosis not present

## 2022-12-14 DIAGNOSIS — G43809 Other migraine, not intractable, without status migrainosus: Secondary | ICD-10-CM | POA: Diagnosis not present

## 2022-12-14 NOTE — Progress Notes (Signed)
  Tiffany Singleton is a 42 y.o. female with the following history as recorded in EpicCare:  There are no problems to display for this patient.   Current Outpatient Medications  Medication Sig Dispense Refill   etonogestrel (NEXPLANON) 68 MG IMPL implant Nexplanon 68 mg subdermal implant  Inject 1 implant every day by subcutaneous route.     Vitamin D, Ergocalciferol, (DRISDOL) 1.25 MG (50000 UNIT) CAPS capsule Take 1 capsule (50,000 Units total) by mouth every 7 (seven) days for 12 doses. 12 capsule 0   No current facility-administered medications for this visit.    Allergies: Hydrocodone, Lexapro [escitalopram], and Sumatriptan  Past Medical History:  Diagnosis Date   Family history of breast cancer    Family history of lung cancer    Family history of ovarian cancer    Family history of prostate cancer     Past Surgical History:  Procedure Laterality Date   LYMPH GLAND EXCISION     WISDOM TOOTH EXTRACTION      Family History  Problem Relation Age of Onset   Lung cancer Mother 40   Ovarian cancer Mother 18   Brain cancer Mother    Hypertension Father    Prostate cancer Father    Breast cancer Paternal Grandmother        dx. in her 80s   Ovarian cancer Paternal Grandmother        dx. in her late 4s    Social History   Tobacco Use   Smoking status: Never   Smokeless tobacco: Never  Substance Use Topics   Alcohol use: Yes    Comment: occ    Subjective:   Visit was initially scheduled as a follow up on Lexapro- patient had to stop due to worsening headaches; she is actually scheduled to see neurology in 2 weeks to discuss treatment options for her migraines;  Notes she has been feeling very good on combination of OTC Ashwaganda and Vitamin D for her anxiety;  Objective:  Vitals:   12/14/22 1248  BP: 122/74  Pulse: 61  SpO2: 98%  Weight: 163 lb (73.9 kg)  Height: 5\' 2"  (1.575 m)    General: Well developed, well nourished, in no acute distress  Skin : Warm and  dry.  Head: Normocephalic and atraumatic  Lungs: Respirations unlabored; clear to auscultation bilaterally without wheeze, rales, rhonchi  CVS exam: normal rate and regular rhythm.  Neurologic: Alert and oriented; speech intact; face symmetrical; moves all extremities well; CNII-XII intact without focal deficit   Assessment:  1. Other fatigue   2. Other migraine without status migrainosus, not intractable   3. Vitamin D deficiency     Plan:  Patient has done better since stopping Lexapro- she is doing well on Ashwaganda and Vitamin D; she will keep planned visit with neurology for 2 weeks to discuss headache management;  Plan to re-check Vitamin D in July 2024;   No follow-ups on file.  No orders of the defined types were placed in this encounter.   Requested Prescriptions    No prescriptions requested or ordered in this encounter

## 2022-12-14 NOTE — Patient Instructions (Addendum)
  Check with your insurance about coverage for the repeat mammogram- let them know what testing they want to do-  Bilateral diagnostic mammography and left breast ultrasound in 6 months to reassess probably benign bilateral breast masses.  Please schedule a follow up with Dr. Ellyn Hack about the Nexplanon;

## 2022-12-26 ENCOUNTER — Ambulatory Visit (INDEPENDENT_AMBULATORY_CARE_PROVIDER_SITE_OTHER): Payer: 59 | Admitting: Diagnostic Neuroimaging

## 2022-12-26 ENCOUNTER — Encounter: Payer: Self-pay | Admitting: Diagnostic Neuroimaging

## 2022-12-26 VITALS — BP 93/68 | HR 73 | Ht 62.0 in | Wt 162.6 lb

## 2022-12-26 DIAGNOSIS — G43109 Migraine with aura, not intractable, without status migrainosus: Secondary | ICD-10-CM | POA: Diagnosis not present

## 2022-12-26 MED ORDER — NURTEC 75 MG PO TBDP: 75.0000 mg | ORAL_TABLET | Freq: Every day | ORAL | 6 refills | Status: DC | PRN

## 2022-12-26 MED ORDER — TOPIRAMATE 50 MG PO TABS: 50.0000 mg | ORAL_TABLET | Freq: Two times a day (BID) | ORAL | 12 refills | Status: DC

## 2022-12-26 NOTE — Patient Instructions (Addendum)
MIGRAINE WITH AURA  MIGRAINE TREATMENT PLAN:  MIGRAINE PREVENTION  -Eat and sleep on a regular schedule -Exercise several times per week - start topiramate 50mg  at bedtime; after 1-2 weeks increase to 50mg  twice a day; drink plenty of water  MIGRAINE RESCUE  - ibuprofen, tylenol as needed - consider rimegepant (Nurtec) 75mg  as needed for breakthrough headache; max 8 per month

## 2022-12-26 NOTE — Progress Notes (Signed)
[facility]  PATIENT: Tiffany Singleton DOB: 03-Dec-1980  REFERRING CLINICIAN: Olive Bass,* HISTORY FROM: patient  REASON FOR VISIT: new consult   HISTORICAL  CHIEF COMPLAINT:  Chief Complaint  Patient presents with   New Patient (Initial Visit)    Patient in room #7 and alone. Patient states she been having migraines and headaches.    HISTORY OF PRESENT ILLNESS:   42 year old female here for evaluation of headaches.  Has had headaches since age 27 years old.  She describes frontal and bilateral severe throbbing headaches with photosensitivity and nausea.  She modified her diet and lifestyle factors and headaches essentially resolved.  In last 4-5 years headaches have returned.  In last few months headaches have increased.  Now more on the right side and retro-orbital pain, up to once per week.  Headaches can last hours to days at a time.  Triggers include wine, cheese, change in caffeine.  Sometimes she sees visual aura with sparkles in her vision.  Patient has tried Excedrin Migraine, Sudafed with mild relief.  Was tried on Imitrex in the past but this caused a tight feeling in the tongue and mouth with increased heart rate.  Mother passed away few years ago from peritoneal cancer with metastasis to the brain.  Paternal grandmother had a brain aneurysm.   REVIEW OF SYSTEMS: Full 14 system review of systems performed and negative with exception of: as per HPI.  ALLERGIES: Allergies  Allergen Reactions   Hydrocodone Hives and Itching   Lexapro [Escitalopram] Other (See Comments)    headache   Sumatriptan Other (See Comments)    Increased heart rate/ sensation of tongue swelling    HOME MEDICATIONS: Outpatient Medications Prior to Visit  Medication Sig Dispense Refill   etonogestrel (NEXPLANON) 68 MG IMPL implant Nexplanon 68 mg subdermal implant  Inject 1 implant every day by subcutaneous route.     Vitamin D, Ergocalciferol, (DRISDOL)  1.25 MG (50000 UNIT) CAPS capsule Take 1 capsule (50,000 Units total) by mouth every 7 (seven) days for 12 doses. 12 capsule 0   No facility-administered medications prior to visit.    PAST MEDICAL HISTORY: Past Medical History:  Diagnosis Date   Family history of breast cancer    Family history of lung cancer    Family history of ovarian cancer    Family history of prostate cancer     PAST SURGICAL HISTORY: Past Surgical History:  Procedure Laterality Date   LYMPH GLAND EXCISION     WISDOM TOOTH EXTRACTION      FAMILY HISTORY: Family History  Problem Relation Age of Onset   Lung cancer Mother 44   Ovarian cancer Mother 28   Brain cancer Mother    Hypertension Father    Prostate cancer Father    Breast cancer Paternal Grandmother        dx. in her 55s   Ovarian cancer Paternal Grandmother        dx. in her late 3s    SOCIAL HISTORY: Social History   Socioeconomic History   Marital status: Single    Spouse name: Not on file   Number of children: Not on file   Years of education: Not on file   Highest education level: Bachelor's degree (e.g., BA, AB, BS)  Occupational History   Not on file  Tobacco Use   Smoking status: Never   Smokeless tobacco: Never  Vaping Use   Vaping Use: Never used  Substance and Sexual Activity  Alcohol use: Yes    Comment: occ   Drug use: No   Sexual activity: Not on file  Other Topics Concern   Not on file  Social History Narrative   Not on file   Social Determinants of Health   Financial Resource Strain: Medium Risk (12/13/2022)   Overall Financial Resource Strain (CARDIA)    Difficulty of Paying Living Expenses: Somewhat hard  Food Insecurity: Food Insecurity Present (12/13/2022)   Hunger Vital Sign    Worried About Running Out of Food in the Last Year: Sometimes true    Ran Out of Food in the Last Year: Sometimes true  Transportation Needs: No Transportation Needs (12/13/2022)   PRAPARE - Scientist, research (physical sciences) (Medical): No    Lack of Transportation (Non-Medical): No  Physical Activity: Sufficiently Active (12/13/2022)   Exercise Vital Sign    Days of Exercise per Week: 5 days    Minutes of Exercise per Session: 30 min  Stress: No Stress Concern Present (12/13/2022)   [employer] of Occupational Health - Occupational Stress Questionnaire    Feeling of Stress : Only a little  Social Connections: Moderately Isolated (12/13/2022)   Social Connection and Isolation Panel [NHANES]    Frequency of Communication with Friends and Family: More than three times a week    Frequency of Social Gatherings with Friends and Family: Once a week    Attends Religious Services: 1 to 4 times per year    Active Member of [employer] or Organizations: No    Attends Engineer, structural: Not on file    Marital Status: Divorced  Intimate Partner Violence: Not on file     PHYSICAL EXAM  GENERAL EXAM/CONSTITUTIONAL: Vitals:  Vitals:   12/26/22 0937  BP: 93/68  Pulse: 73  Weight: 162 lb 9.6 oz (73.8 kg)  Height: 5\' 2"  (1.575 m)   Body mass index is 29.74 kg/m. Wt Readings from Last 3 Encounters:  12/26/22 162 lb 9.6 oz (73.8 kg)  12/14/22 163 lb (73.9 kg)  10/19/22 166 lb 12.8 oz (75.7 kg)   Patient is in no distress; well developed, nourished and groomed; neck is supple  CARDIOVASCULAR: Examination of carotid arteries is normal; no carotid bruits Regular rate and rhythm, no murmurs Examination of peripheral vascular system by observation and palpation is normal  EYES: Ophthalmoscopic exam of optic discs and posterior segments is normal; no papilledema or hemorrhages No results found.  MUSCULOSKELETAL: Gait, strength, tone, movements noted in Neurologic exam below  NEUROLOGIC: MENTAL STATUS:      No data to display         awake, alert, oriented to person, place and time recent and remote memory intact normal attention and concentration language fluent, comprehension  intact, naming intact fund of knowledge appropriate  CRANIAL NERVE:  2nd - no papilledema on fundoscopic exam 2nd, 3rd, 4th, 6th - pupils equal and reactive to light, visual fields full to confrontation, extraocular muscles intact, no nystagmus 5th - facial sensation symmetric 7th - facial strength symmetric 8th - hearing intact 9th - palate elevates symmetrically, uvula midline 11th - shoulder shrug symmetric 12th - tongue protrusion midline  MOTOR:  normal bulk and tone, full strength in the BUE, BLE  SENSORY:  normal and symmetric to light touch, temperature, vibration  COORDINATION:  finger-nose-finger, fine finger movements normal  REFLEXES:  deep tendon reflexes present and symmetric  GAIT/STATION:  narrow based gait     DIAGNOSTIC DATA (LABS, IMAGING, TESTING) -  I reviewed patient records, labs, notes, testing and imaging myself where available.  Lab Results  Component Value Date   WBC 8.5 10/19/2022   HGB 13.7 10/19/2022   HCT 41.0 10/19/2022   MCV 90.7 10/19/2022   PLT 153.0 10/19/2022      Component Value Date/Time   NA 138 10/19/2022 1138   K 4.3 10/19/2022 1138   CL 102 10/19/2022 1138   CO2 29 10/19/2022 1138   GLUCOSE 86 10/19/2022 1138   BUN 7 10/19/2022 1138   CREATININE 0.81 10/19/2022 1138   CALCIUM 9.5 10/19/2022 1138   PROT 7.1 10/19/2022 1138   ALBUMIN 4.1 10/19/2022 1138   AST 15 10/19/2022 1138   ALT 14 10/19/2022 1138   ALKPHOS 60 10/19/2022 1138   BILITOT 0.5 10/19/2022 1138   GFRNONAA >60 12/01/2009 1131   GFRAA  12/01/2009 1131    >60        The eGFR has been calculated using the MDRD equation. This calculation has not been validated in all clinical situations. eGFR's persistently <60 mL/min signify possible Chronic Kidney Disease.   Lab Results  Component Value Date   CHOL 169 10/19/2022   HDL 55.80 10/19/2022   LDLCALC 105 (H) 10/19/2022   TRIG 44.0 10/19/2022   CHOLHDL 3 10/19/2022   Lab Results  Component  Value Date   HGBA1C 5.3 10/19/2022   Lab Results  Component Value Date   VITAMINB12 257 10/19/2022   Lab Results  Component Value Date   TSH 0.77 10/19/2022       ASSESSMENT AND PLAN  42 y.o. year old female here with:  Meds tried: ibuprofen, tylenol, Excedrin migraine, sumatriptan (palpitations)  Dx:  1. Migraine with aura and without status migrainosus, not intractable     PLAN:  MIGRAINE WITH AURA - worsening in setting of increased stress and diet changes; encouraged to make lifestyle changes and try migraine meds; if not improving, then consider MRI brain w/wo  MIGRAINE TREATMENT PLAN:  MIGRAINE PREVENTION  -Eat and sleep on a regular schedule -Exercise several times per week - start topiramate 50mg  at bedtime; after 1-2 weeks increase to 50mg  twice a day; drink plenty of water  Consider 2nd line - rimegepant (Nurtec) 75mg  every other day - atogepant (Qulipta) 60mg  daily - erenumab (Aimovig) 70mg  monthly (may increase to 140mg  monthly) - fremanezumab (Ajovy) 225mg  monthly (or 675mg  every 3 months) - galazanezumab (Emgality) 240mg  loading dose; then 120mg  monthly  MIGRAINE RESCUE  - ibuprofen, tylenol as needed - consider rimegepant (Nurtec) 75mg  as needed for breakthrough headache; max 8 per month  Meds ordered this encounter  Medications   Rimegepant Sulfate (NURTEC) 75 MG TBDP    Sig: Take 1 tablet (75 mg total) by mouth daily as needed.    Dispense:  8 tablet    Refill:  6   topiramate (TOPAMAX) 50 MG tablet    Sig: Take 1 tablet (50 mg total) by mouth 2 (two) times daily.    Dispense:  60 tablet    Refill:  12   Return in about 4 months (around 04/28/2023) for MyChart visit (15 min).    Suanne Marker, MD 12/26/2022, 10:43 AM Certified in Neurology, Neurophysiology and Neuroimaging  [facility] [address], [address] [place], [place] [address] [phone]

## 2022-12-27 ENCOUNTER — Telehealth: Payer: Self-pay

## 2022-12-27 ENCOUNTER — Other Ambulatory Visit (HOSPITAL_COMMUNITY): Payer: Self-pay

## 2022-12-27 NOTE — Telephone Encounter (Signed)
Pharmacy Patient Advocate Encounter   Received notification from [facility] that prior authorization for Nurtec 75MG  ODT is required/requested.   PA submitted on 12/27/2022 to (ins) [employer] via [employer] or (Medicaid) confirmation # [id number] Status is pending

## 2022-12-30 NOTE — Telephone Encounter (Signed)
Pharmacy Patient Advocate Encounter  Received notification from Jari Favre that the request for prior authorization for Nurtec ODT 75MG  has been denied due to see below.          Please be advised we currently do not have a Pharmacist to review denials, therefore you will need to process appeals accordingly as needed. Thanks for your support at this time.   You may call  or fax , to appeal.

## 2023-01-03 ENCOUNTER — Other Ambulatory Visit: Payer: Self-pay | Admitting: Family

## 2023-01-15 NOTE — Telephone Encounter (Signed)
The 2024 [facility] Formulary for migraine medications are copied below:

## 2023-01-15 NOTE — Telephone Encounter (Signed)
Pharmacy Patient Advocate Encounter   Received notification from [facility] that prior authorization for Nurtec 75MG  dispersible tablets is required/requested.   PA submitted to [facility] via CoverMyMeds Key or (Medicaid) confirmation # [id number] Status is pending

## 2023-01-16 NOTE — Telephone Encounter (Signed)
Pharmacy Patient Advocate Encounter  Received notification from Jari Favre that the request for prior authorization for Nurtec has been denied due to see below.     Please be advised we currently do not have a Pharmacist to review denials, therefore you will need to process appeals accordingly as needed. Thanks for your support at this time.   You may call [phone] or fax [phone], to appeal.  The denial letter is in the chart under the media tab.

## 2023-01-16 NOTE — Telephone Encounter (Signed)
Attempted to renew and resubmit a PA on CMM for the medication.   The message received was "Your PA request has been closed. Aborting and rebuilding as a Reconsideration for PA# [id number]"  Will await a determination since appears it is being reviewed.

## 2023-01-18 ENCOUNTER — Other Ambulatory Visit (HOSPITAL_COMMUNITY): Payer: Self-pay

## 2023-01-18 NOTE — Telephone Encounter (Signed)
Pharmacy Patient Advocate Encounter  Prior Authorization for Nurtec ODT 75MG  Tablets has been APPROVED by  Jari Favre  from 01/17/2023 to 07/19/2023.   PA #    Copay is $19.97 for a 30DS/ 8 Tablets per [facility] Test Claim.

## 2023-01-21 ENCOUNTER — Telehealth: Payer: Self-pay | Admitting: *Deleted

## 2023-01-21 ENCOUNTER — Other Ambulatory Visit: Payer: Self-pay | Admitting: Family

## 2023-02-04 ENCOUNTER — Encounter: Payer: Self-pay | Admitting: Family

## 2023-02-04 ENCOUNTER — Other Ambulatory Visit: Payer: Self-pay | Admitting: Family

## 2023-02-04 DIAGNOSIS — E559 Vitamin D deficiency, unspecified: Secondary | ICD-10-CM

## 2023-02-14 ENCOUNTER — Other Ambulatory Visit: Payer: Self-pay | Admitting: Family

## 2023-04-30 ENCOUNTER — Telehealth: Payer: 59 | Admitting: Diagnostic Neuroimaging

## 2023-04-30 ENCOUNTER — Encounter: Payer: Self-pay | Admitting: Diagnostic Neuroimaging

## 2023-04-30 DIAGNOSIS — G43109 Migraine with aura, not intractable, without status migrainosus: Secondary | ICD-10-CM | POA: Diagnosis not present

## 2023-04-30 MED ORDER — NURTEC 75 MG PO TBDP: 75.0000 mg | ORAL_TABLET | Freq: Every day | ORAL | 6 refills | Status: DC | PRN

## 2023-04-30 MED ORDER — TOPIRAMATE 50 MG PO TABS: 50.0000 mg | ORAL_TABLET | Freq: Two times a day (BID) | ORAL | 12 refills | Status: AC

## 2023-04-30 NOTE — Patient Instructions (Signed)
MIGRAINE WITH AURA  MIGRAINE PREVENTION (up to 8 days HA per month) -Eat and sleep on a regular schedule -Exercise several times per week - will request work place accomodation to work from home during migraine days - continue topiramate 50mg  at bedtime; try to increase to 100mg  at bedtime; drink plenty of water  MIGRAINE RESCUE  - ibuprofen, tylenol as needed - consider rimegepant (Nurtec) 75mg  as needed for breakthrough headache; max 8 per month

## 2023-04-30 NOTE — Progress Notes (Signed)
[facility]  PATIENT: Tiffany Singleton DOB: 14-Nov-1980  REFERRING CLINICIAN: Olive Bass,* HISTORY FROM: patient  REASON FOR VISIT: follow up   HISTORICAL  CHIEF COMPLAINT:  Chief Complaint  Patient presents with   Migraine         HISTORY OF PRESENT ILLNESS:   UPDATE (04/30/23, VRP): Since last visit, doing better on TPX; except more severe HA in last few weeks. Nurtec helps for breakthrough HA. Caffeine, sugar aggravate HA.   PRIOR HPI (12/26/22, VRP): 42 year old female here for evaluation of headaches.  Has had headaches since age 54 years old.  She describes frontal and bilateral severe throbbing headaches with photosensitivity and nausea.  She modified her diet and lifestyle factors and headaches essentially resolved.  In last 4-5 years headaches have returned.  In last few months headaches have increased.  Now more on the right side and retro-orbital pain, up to once per week.  Headaches can last hours to days at a time.  Triggers include wine, cheese, change in caffeine.  Sometimes she sees visual aura with sparkles in her vision.  Patient has tried Excedrin Migraine, Sudafed with mild relief.  Was tried on Imitrex in the past but this caused a tight feeling in the tongue and mouth with increased heart rate.  Mother passed away few years ago from peritoneal cancer with metastasis to the brain.  Paternal grandmother had a brain aneurysm.   REVIEW OF SYSTEMS: Full 14 system review of systems performed and negative with exception of: as per HPI.  ALLERGIES: Allergies  Allergen Reactions   Hydrocodone Hives and Itching   Lexapro [Escitalopram] Other (See Comments)    headache   Sumatriptan Other (See Comments)    Increased heart rate/ sensation of tongue swelling    HOME MEDICATIONS: Outpatient Medications Prior to Visit  Medication Sig Dispense Refill   etonogestrel (NEXPLANON) 68 MG IMPL implant Nexplanon 68 mg subdermal implant   Inject 1 implant every day by subcutaneous route.     Rimegepant Sulfate (NURTEC) 75 MG TBDP Take 1 tablet (75 mg total) by mouth daily as needed. 8 tablet 6   topiramate (TOPAMAX) 50 MG tablet Take 1 tablet (50 mg total) by mouth 2 (two) times daily. 60 tablet 12   No facility-administered medications prior to visit.    PAST MEDICAL HISTORY: Past Medical History:  Diagnosis Date   Family history of breast cancer    Family history of lung cancer    Family history of ovarian cancer    Family history of prostate cancer     PAST SURGICAL HISTORY: Past Surgical History:  Procedure Laterality Date   LYMPH GLAND EXCISION     WISDOM TOOTH EXTRACTION      FAMILY HISTORY: Family History  Problem Relation Age of Onset   Lung cancer Mother 64   Ovarian cancer Mother 63   Brain cancer Mother    Hypertension Father    Prostate cancer Father    Breast cancer Paternal Grandmother        dx. in her 38s   Ovarian cancer Paternal Grandmother        dx. in her late 15s    SOCIAL HISTORY: Social History   Socioeconomic History   Marital status: Single    Spouse name: Not on file   Number of children: Not on file   Years of education: Not on file   Highest education level: Bachelor's degree (e.g., BA, AB, BS)  Occupational History  Not on file  Tobacco Use   Smoking status: Never   Smokeless tobacco: Never  Vaping Use   Vaping status: Never Used  Substance and Sexual Activity   Alcohol use: Yes    Comment: occ   Drug use: No   Sexual activity: Not on file  Other Topics Concern   Not on file  Social History Narrative   Not on file   Social Determinants of Health   Financial Resource Strain: Medium Risk (12/13/2022)   Overall Financial Resource Strain (CARDIA)    Difficulty of Paying Living Expenses: Somewhat hard  Food Insecurity: Food Insecurity Present (12/13/2022)   Hunger Vital Sign    Worried About Running Out of Food in the Last Year: Sometimes true    Ran Out of  Food in the Last Year: Sometimes true  Transportation Needs: No Transportation Needs (12/13/2022)   PRAPARE - Administrator, Civil Service (Medical): No    Lack of Transportation (Non-Medical): No  Physical Activity: Sufficiently Active (12/13/2022)   Exercise Vital Sign    Days of Exercise per Week: 5 days    Minutes of Exercise per Session: 30 min  Stress: No Stress Concern Present (12/13/2022)   [employer] of Occupational Health - Occupational Stress Questionnaire    Feeling of Stress : Only a little  Social Connections: Moderately Isolated (12/13/2022)   Social Connection and Isolation Panel [NHANES]    Frequency of Communication with Friends and Family: More than three times a week    Frequency of Social Gatherings with Friends and Family: Once a week    Attends Religious Services: 1 to 4 times per year    Active Member of [employer] or Organizations: No    Attends Engineer, structural: Not on file    Marital Status: Divorced  Intimate Partner Violence: Low Risk  (09/25/2020)   Received from [facility]   Intimate Partner Violence    Insults You: Not on file    Threatens You: Not on file    Screams at You: Not on file    Physically Hurt: Not on file    Intimate Partner Violence Score: Not on file     PHYSICAL EXAM  GENERAL EXAM/CONSTITUTIONAL: Vitals:  There were no vitals filed for this visit.  There is no height or weight on file to calculate BMI. Wt Readings from Last 3 Encounters:  12/26/22 162 lb 9.6 oz (73.8 kg)  12/14/22 163 lb (73.9 kg)  10/19/22 166 lb 12.8 oz (75.7 kg)   Patient is in no distress; well developed, nourished and groomed; neck is supple  CARDIOVASCULAR: Examination of carotid arteries is normal; no carotid bruits Regular rate and rhythm, no murmurs Examination of peripheral vascular system by observation and palpation is normal  EYES: Ophthalmoscopic exam of optic discs and posterior segments is normal; no papilledema or  hemorrhages No results found.  MUSCULOSKELETAL: Gait, strength, tone, movements noted in Neurologic exam below  NEUROLOGIC: MENTAL STATUS:      No data to display         awake, alert, oriented to person, place and time recent and remote memory intact normal attention and concentration language fluent, comprehension intact, naming intact fund of knowledge appropriate  CRANIAL NERVE:  2nd - no papilledema on fundoscopic exam 2nd, 3rd, 4th, 6th - pupils equal and reactive to light, visual fields full to confrontation, extraocular muscles intact, no nystagmus 5th - facial sensation symmetric 7th - facial strength symmetric 8th - hearing  intact 9th - palate elevates symmetrically, uvula midline 11th - shoulder shrug symmetric 12th - tongue protrusion midline  MOTOR:  normal bulk and tone, full strength in the BUE, BLE  SENSORY:  normal and symmetric to light touch, temperature, vibration  COORDINATION:  finger-nose-finger, fine finger movements normal  REFLEXES:  deep tendon reflexes present and symmetric  GAIT/STATION:  narrow based gait     DIAGNOSTIC DATA (LABS, IMAGING, TESTING) - I reviewed patient records, labs, notes, testing and imaging myself where available.  Lab Results  Component Value Date   WBC 8.5 10/19/2022   HGB 13.7 10/19/2022   HCT 41.0 10/19/2022   MCV 90.7 10/19/2022   PLT 153.0 10/19/2022      Component Value Date/Time   NA 138 10/19/2022 1138   K 4.3 10/19/2022 1138   CL 102 10/19/2022 1138   CO2 29 10/19/2022 1138   GLUCOSE 86 10/19/2022 1138   BUN 7 10/19/2022 1138   CREATININE 0.81 10/19/2022 1138   CALCIUM 9.5 10/19/2022 1138   PROT 7.1 10/19/2022 1138   ALBUMIN 4.1 10/19/2022 1138   AST 15 10/19/2022 1138   ALT 14 10/19/2022 1138   ALKPHOS 60 10/19/2022 1138   BILITOT 0.5 10/19/2022 1138   GFRNONAA >60 12/01/2009 1131   GFRAA  12/01/2009 1131    >60        The eGFR has been calculated using the MDRD equation. This  calculation has not been validated in all clinical situations. eGFR's persistently <60 mL/min signify possible Chronic Kidney Disease.   Lab Results  Component Value Date   CHOL 169 10/19/2022   HDL 55.80 10/19/2022   LDLCALC 105 (H) 10/19/2022   TRIG 44.0 10/19/2022   CHOLHDL 3 10/19/2022   Lab Results  Component Value Date   HGBA1C 5.3 10/19/2022   Lab Results  Component Value Date   VITAMINB12 257 10/19/2022   Lab Results  Component Value Date   TSH 0.77 10/19/2022       ASSESSMENT AND PLAN  42 y.o. year old female here with:  Meds tried: ibuprofen, tylenol, Excedrin migraine, sumatriptan (palpitations)  Dx:  1. Migraine with aura and without status migrainosus, not intractable      PLAN:  MIGRAINE WITH AURA  MIGRAINE PREVENTION (better on topiramate; sometimes up to 8 days HA per month) -Eat and sleep on a regular schedule -Exercise several times per week - will request work place accomodation to work from home during migraine days - continue topiramate 50mg  at bedtime; try to increase to 100mg  at bedtime; drink plenty of water  Consider 2nd line - rimegepant (Nurtec) 75mg  every other day - atogepant Bennie Pierini) 60mg  daily - erenumab (Aimovig) 70mg  monthly (may increase to 140mg  monthly) - fremanezumab (Ajovy) 225mg  monthly (or 675mg  every 3 months) - galazanezumab (Emgality) 240mg  loading dose; then 120mg  monthly  MIGRAINE RESCUE  - ibuprofen, tylenol as needed - consider rimegepant (Nurtec) 75mg  as needed for breakthrough headache; max 8 per month  Meds ordered this encounter  Medications   Rimegepant Sulfate (NURTEC) 75 MG TBDP    Sig: Take 1 tablet (75 mg total) by mouth daily as needed.    Dispense:  8 tablet    Refill:  6   topiramate (TOPAMAX) 50 MG tablet    Sig: Take 1 tablet (50 mg total) by mouth 2 (two) times daily.    Dispense:  60 tablet    Refill:  12   Return in about 9 months (around 01/28/2024).  Virtual Visit  via Video  Note  I connected with Tiffany Singleton on 04/30/23 at  2:15 PM EDT by a video enabled telemedicine application and verified that I am speaking with the correct person using two identifiers.   I discussed the limitations of evaluation and management by telemedicine and the availability of in person appointments. The patient expressed understanding and agreed to proceed.  Patient is at home and I am at the office.   I spent 15 minutes of face-to-face and non-face-to-face time with patient.  This included previsit chart review, lab review, study review, order entry, electronic health record documentation, patient education.      Suanne Marker, MD 04/30/2023, 2:01 PM Certified in Neurology, Neurophysiology and Neuroimaging  [facility] [address] [place], [place] [address] [phone]

## 2023-05-02 ENCOUNTER — Encounter: Payer: Self-pay | Admitting: Diagnostic Neuroimaging

## 2023-05-15 ENCOUNTER — Encounter: Payer: Self-pay | Admitting: *Deleted

## 2023-05-15 DIAGNOSIS — Z0289 Encounter for other administrative examinations: Secondary | ICD-10-CM

## 2023-06-16 IMAGING — MG DIGITAL DIAGNOSTIC BILAT W/ TOMO W/ CAD
8 series · 8 of 24 positions shown · non-contrast
Comparison: Previous exam(s).

CLINICAL DATA: Patient recalled from screening for bilateral breast
masses.



[L CC synth-2D]
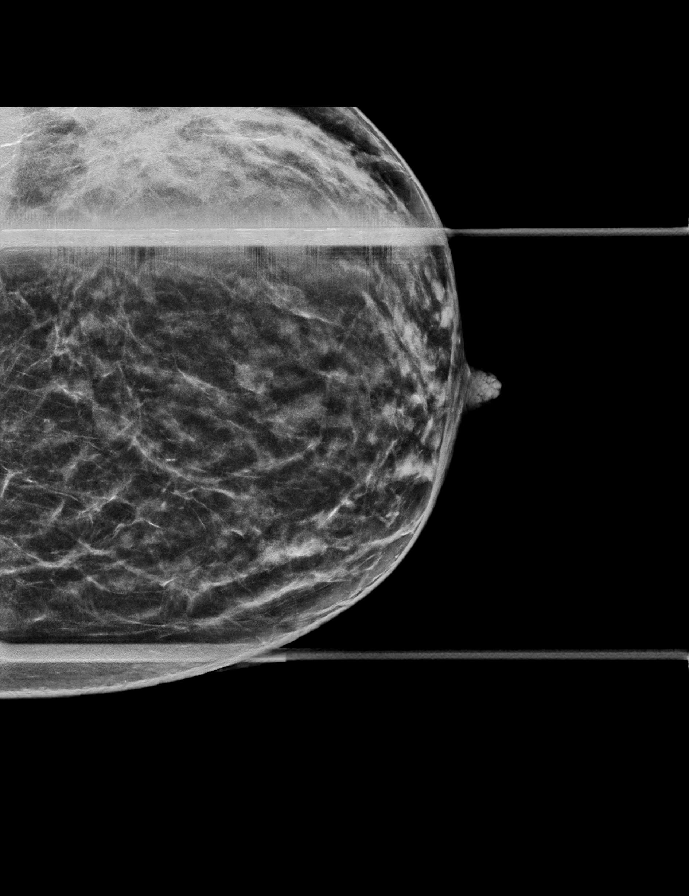

[L MLO synth-2D]
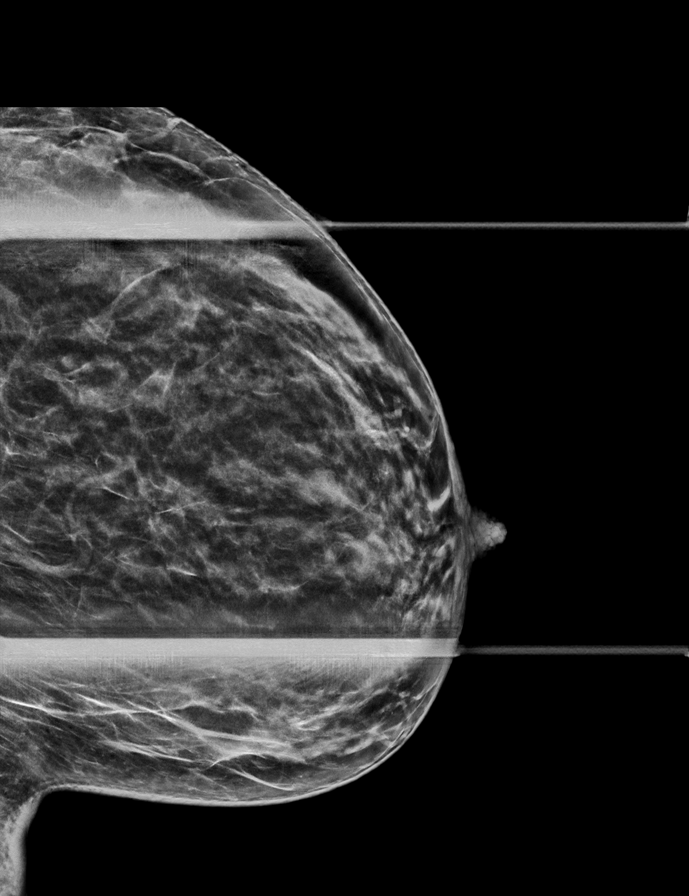

[R CC synth-2D]
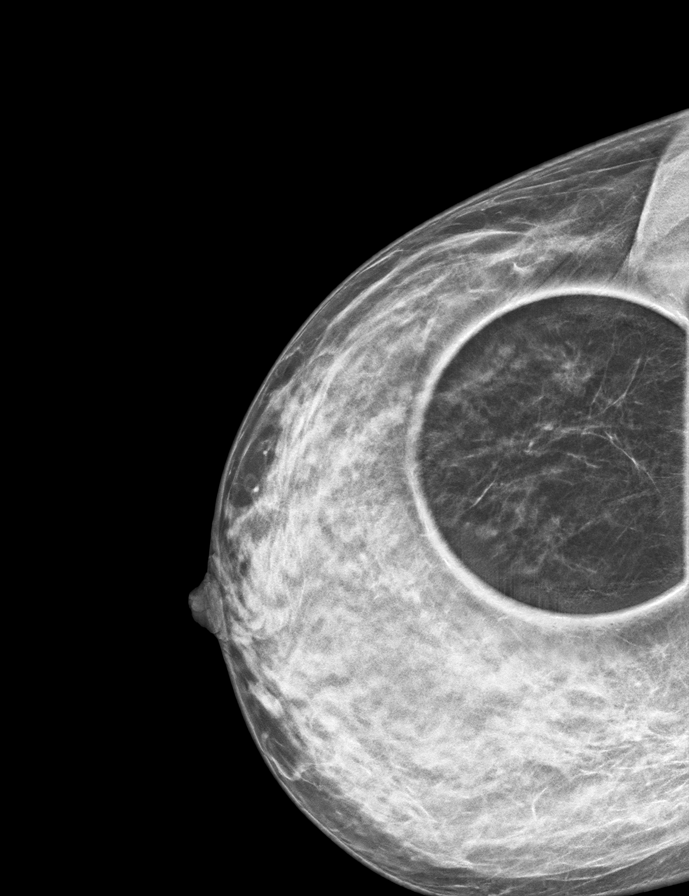

[R ML synth-2D]
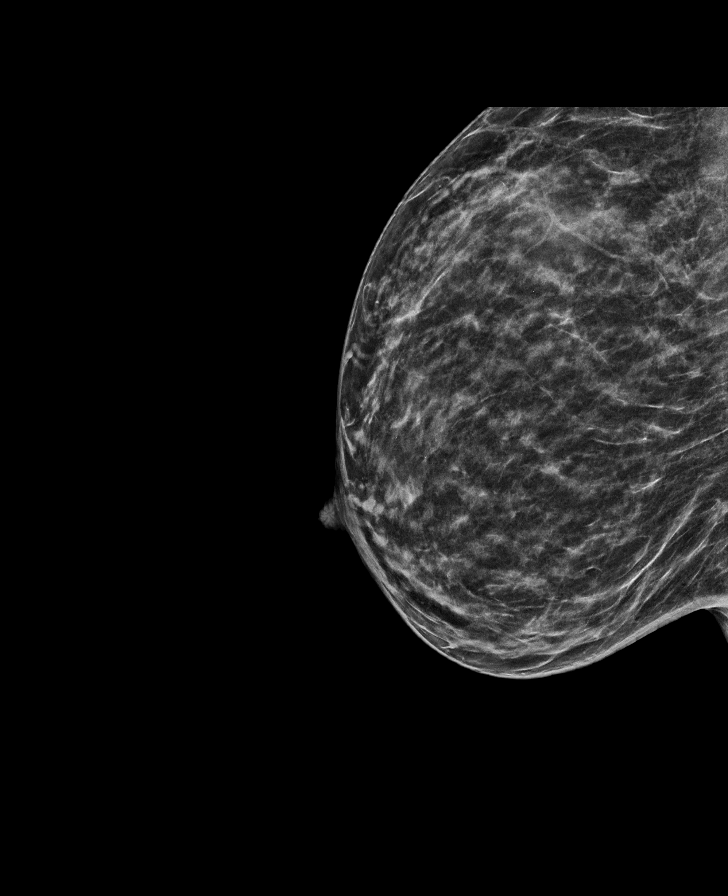

[R ML tomo · tomo slice 27/53.0]
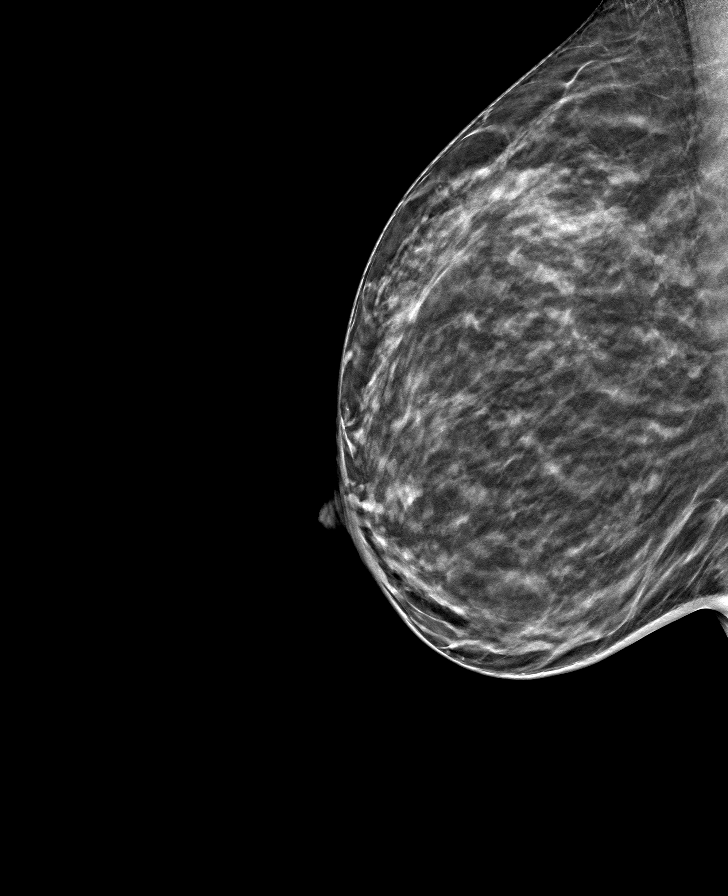

[L CC tomo · tomo slice 28/55.0]
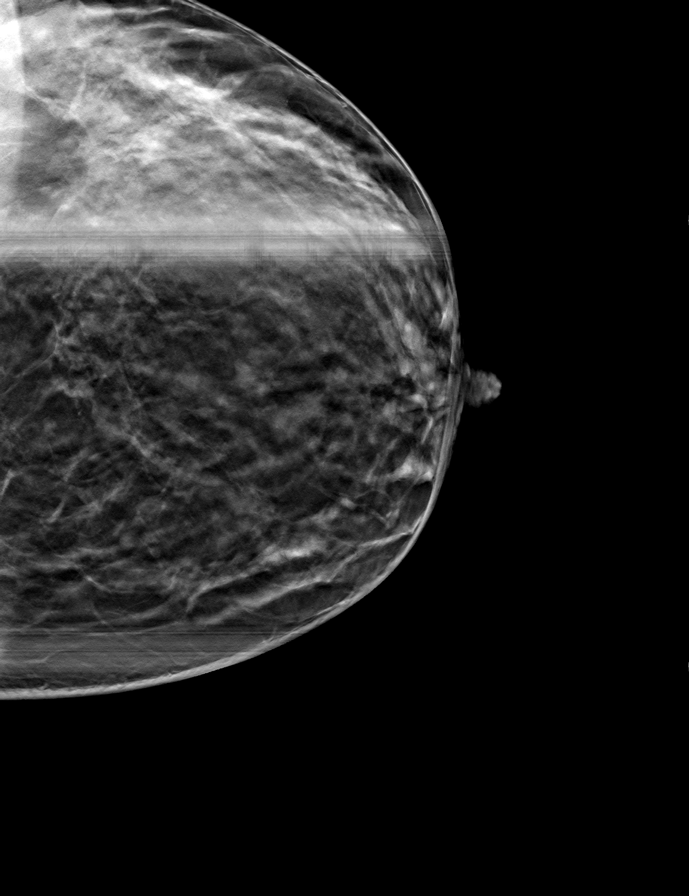

[R CC tomo · tomo slice 21/42.0]
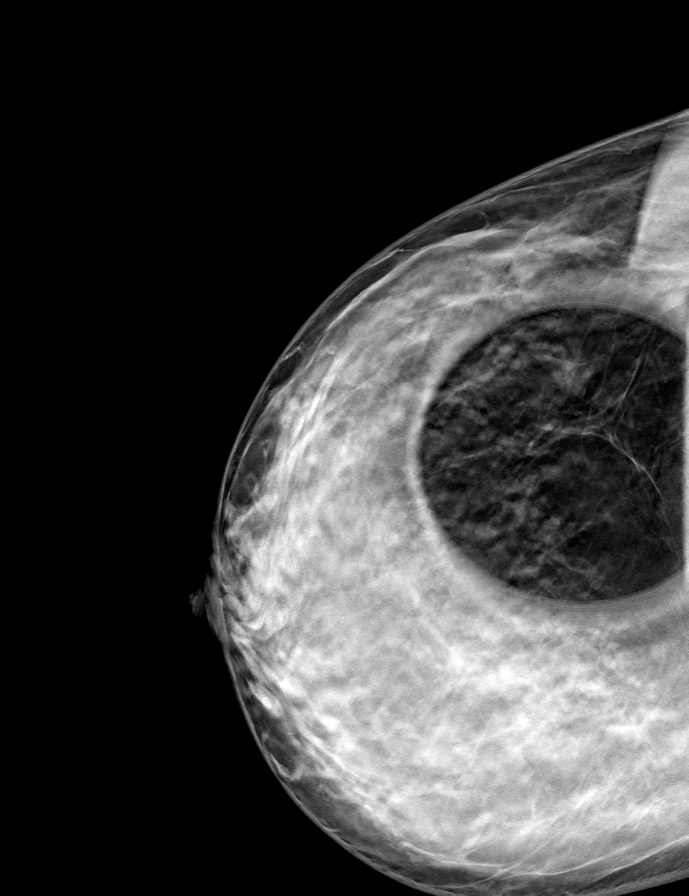

[L MLO tomo · tomo slice 29/57.0]
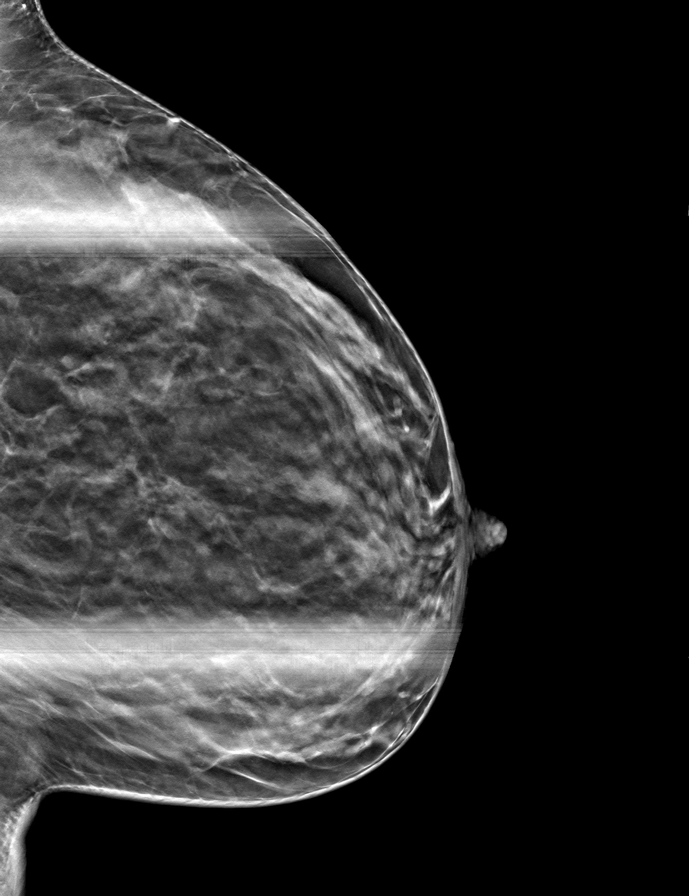

[8 of 24 positions shown; findings below may reference images not displayed]

ACR Breast Density Category c: The breast tissue is heterogeneously
dense, which may obscure small masses.
FINDINGS: Within the superior slightly lateral right breast there is a
persistent small oval mass further evaluated with spot compression
views.

Within the upper inner left breast there are two adjacent oval
circumscribed masses, further evaluated with spot compression views.

Targeted ultrasound is performed, showing a 7 x 6 x 3 mm lobular
hypoechoic mass within the left breast 10 o'clock position 4 cm from
the nipple. There is an adjacent 5 x 3 x 5 mm oval circumscribed
mass within the left breast 10 o'clock position 4 cm from nipple.

Within the left breast 11 o'clock position 3 cm from nipple there is
a 10 x 9 x 4 mm oval circumscribed mass. There is an adjacent 4 x 2
x 5 mm oval circumscribed hypoechoic mass.

No definite sonographic correlate identified within the right breast
for the mammographically identified mass.
IMPRESSION: Probably benign left breast masses, favored to represent
fibroadenomas.

Probably benign sonographically occult right breast mass.

RECOMMENDATION:
Bilateral diagnostic mammography and left breast ultrasound in 6
months to reassess probably benign bilateral breast masses.

I have discussed the findings and recommendations with the patient.
If applicable, a reminder letter will be sent to the patient
regarding the next appointment.

BI-RADS CATEGORY  3: Probably benign.

## 2023-06-16 IMAGING — US US BREAST*R* LIMITED INC AXILLA
1 series · 8 of 8 positions shown · non-contrast
Comparison: Previous exam(s).

CLINICAL DATA: Patient recalled from screening for bilateral breast
masses.



[Series 1: us breast*right* limited inc axilla · 0.06mm/px · 8 of 8 slices shown]
[im 1/8]
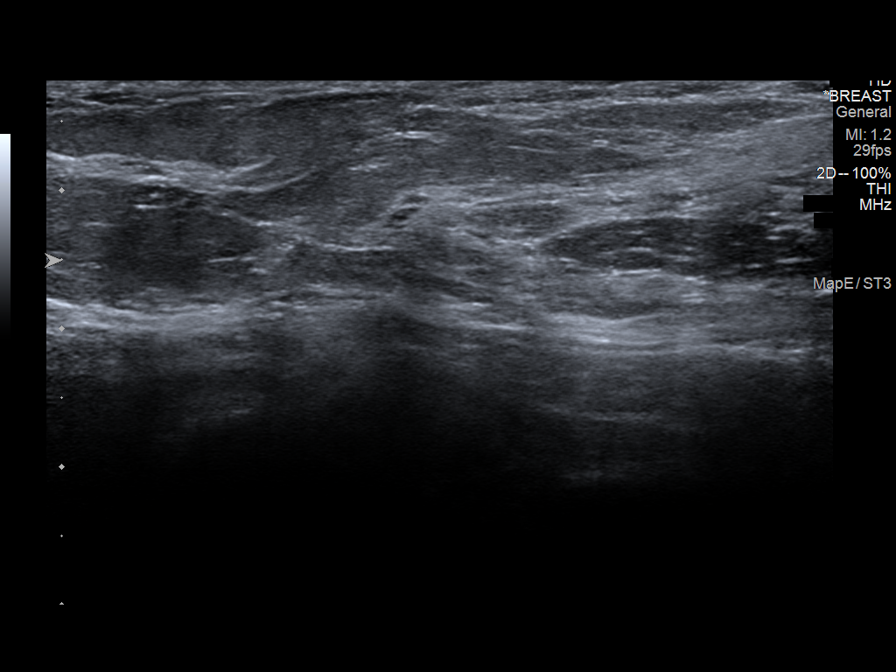
[im 2/8]
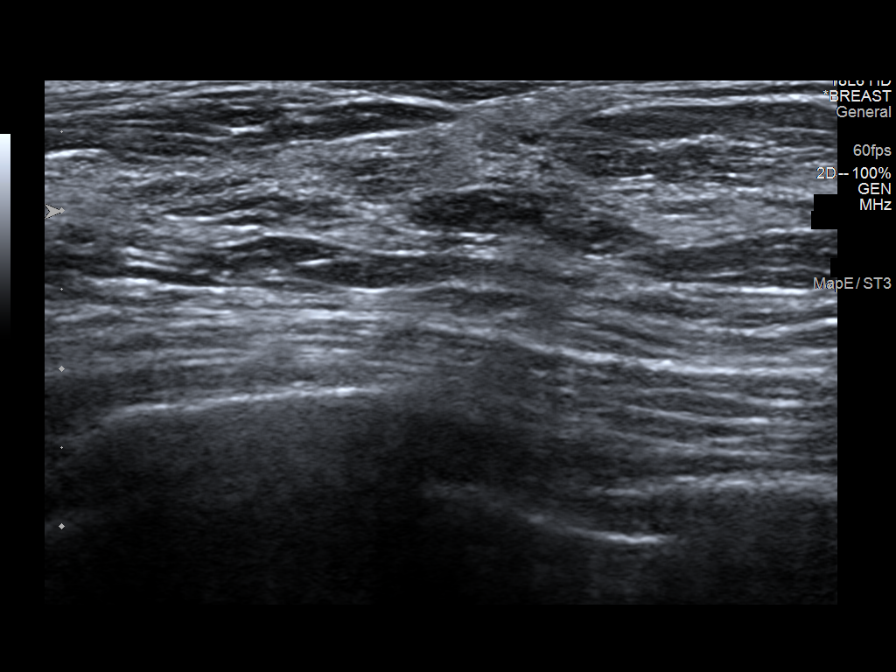
[im 3/8]
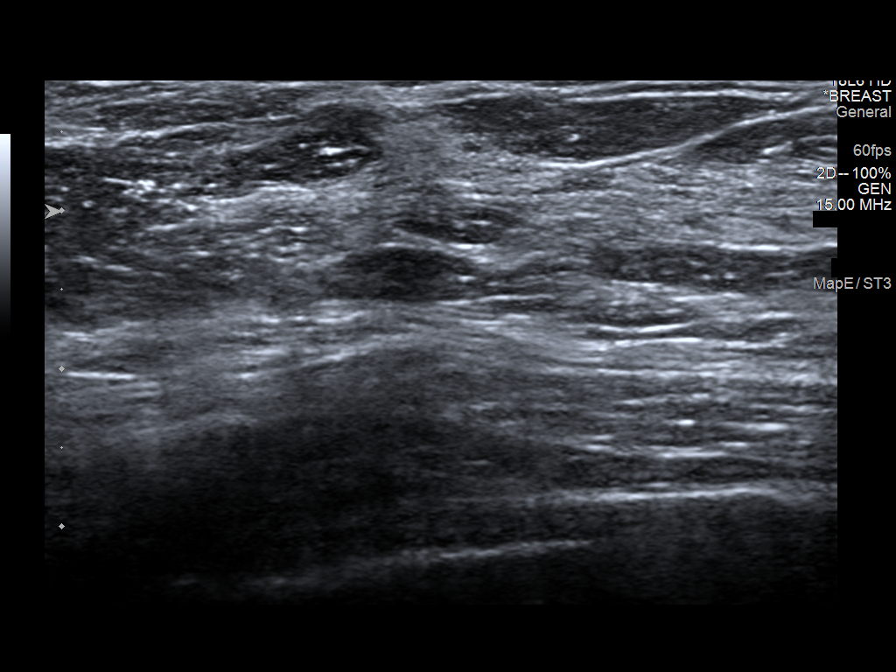
[im 4/8]
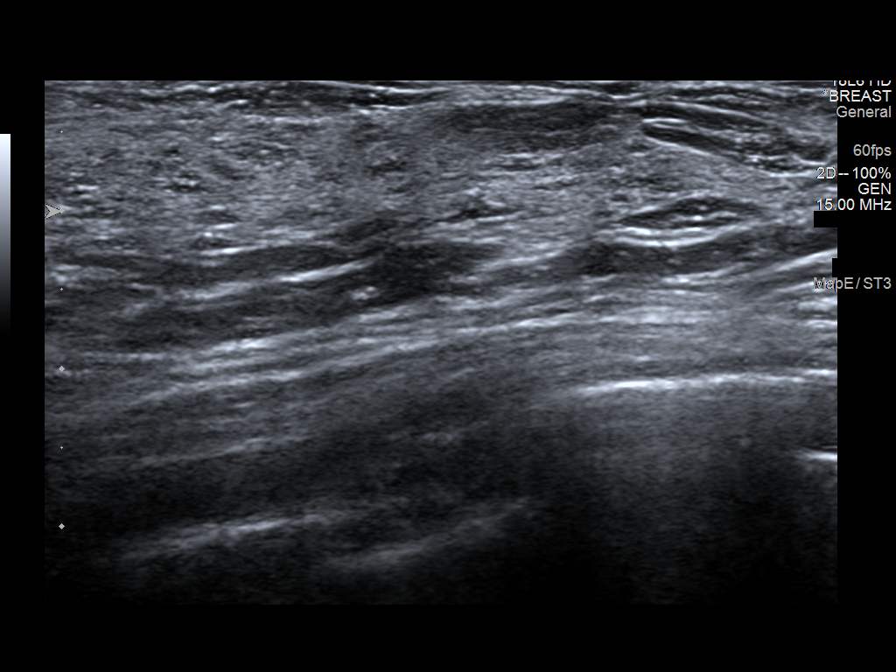
[im 5/8]
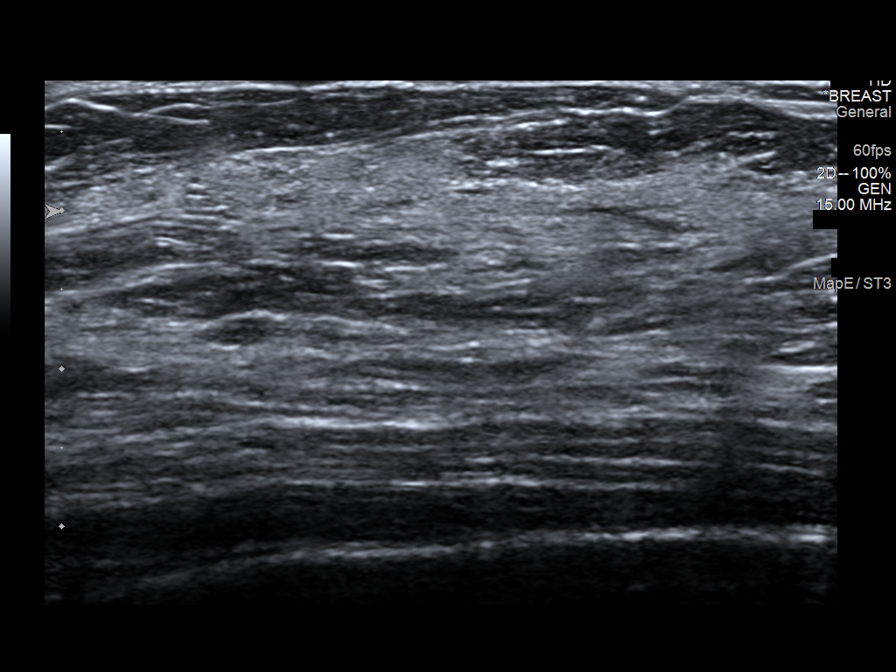
[im 6/8]
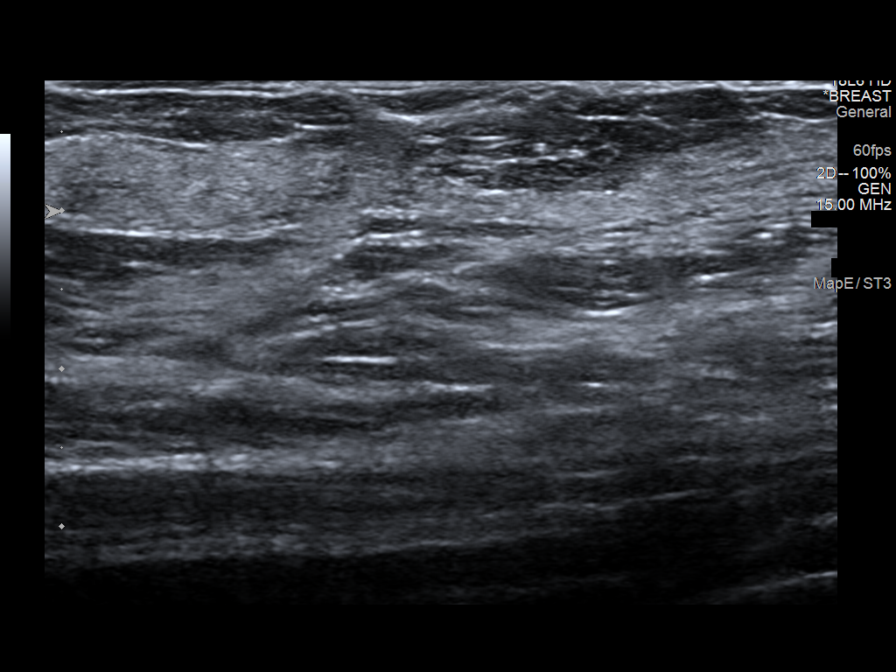
[im 7/8]
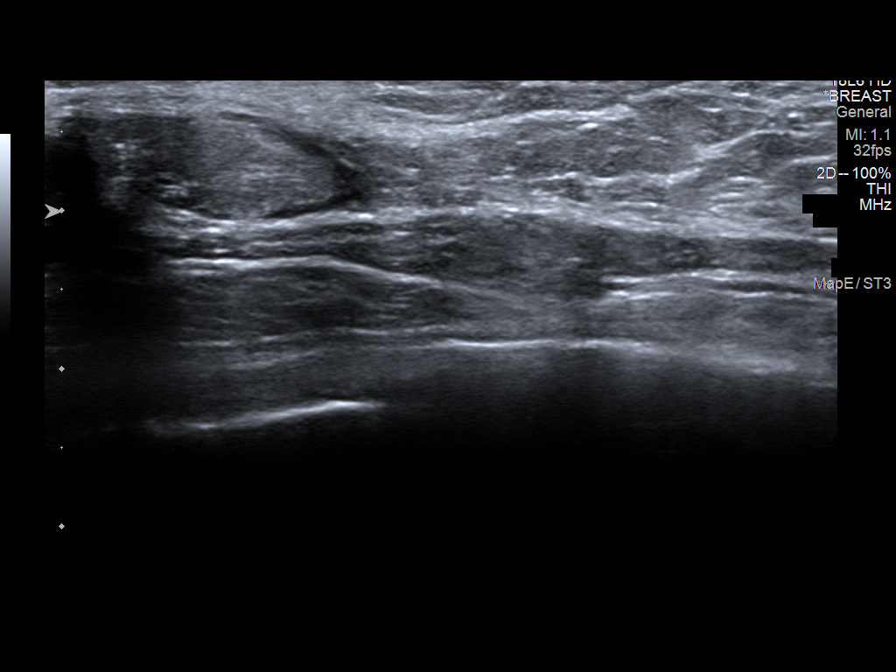
[im 8/8]
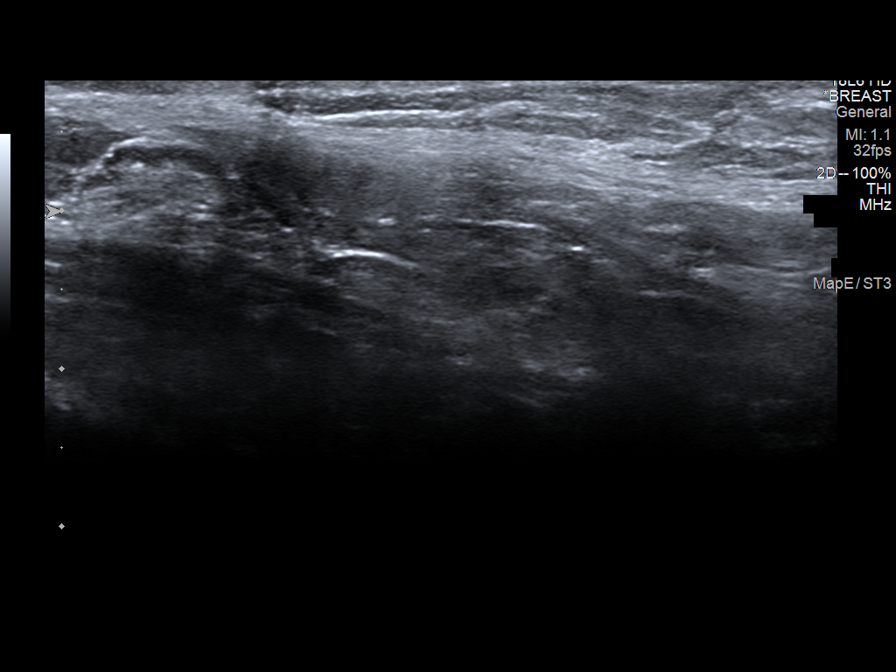

[8 of 8 positions shown; findings below may reference images not displayed]

ACR Breast Density Category c: The breast tissue is heterogeneously
dense, which may obscure small masses.
FINDINGS: Within the superior slightly lateral right breast there is a
persistent small oval mass further evaluated with spot compression
views.

Within the upper inner left breast there are two adjacent oval
circumscribed masses, further evaluated with spot compression views.

Targeted ultrasound is performed, showing a 7 x 6 x 3 mm lobular
hypoechoic mass within the left breast 10 o'clock position 4 cm from
the nipple. There is an adjacent 5 x 3 x 5 mm oval circumscribed
mass within the left breast 10 o'clock position 4 cm from nipple.

Within the left breast 11 o'clock position 3 cm from nipple there is
a 10 x 9 x 4 mm oval circumscribed mass. There is an adjacent 4 x 2
x 5 mm oval circumscribed hypoechoic mass.

No definite sonographic correlate identified within the right breast
for the mammographically identified mass.
IMPRESSION: Probably benign left breast masses, favored to represent
fibroadenomas.

Probably benign sonographically occult right breast mass.

RECOMMENDATION:
Bilateral diagnostic mammography and left breast ultrasound in 6
months to reassess probably benign bilateral breast masses.

I have discussed the findings and recommendations with the patient.
If applicable, a reminder letter will be sent to the patient
regarding the next appointment.

BI-RADS CATEGORY  3: Probably benign.

## 2023-06-16 IMAGING — US US BREAST*L* LIMITED INC AXILLA
1 series · 13 of 22 positions shown · non-contrast
Comparison: Previous exam(s).

CLINICAL DATA: Patient recalled from screening for bilateral breast
masses.



[Series 1: us breast*left* limited inc axilla · 0.06mm/px · 13 of 22 slices shown]
[im 1/22]
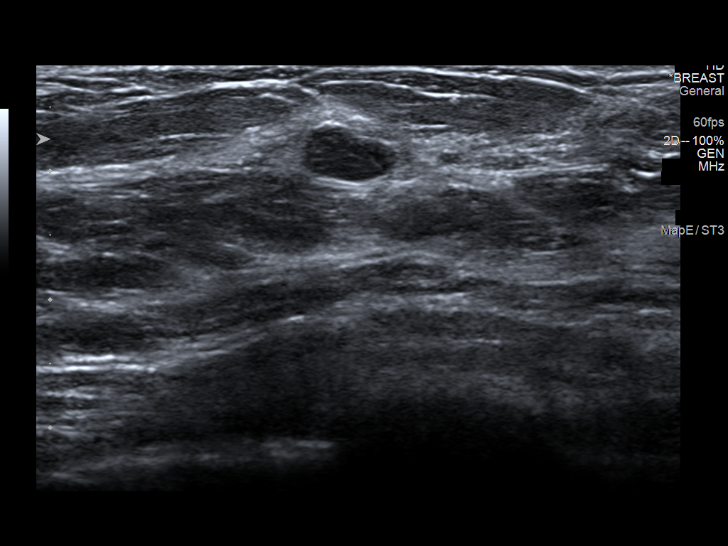
[im 3/22]
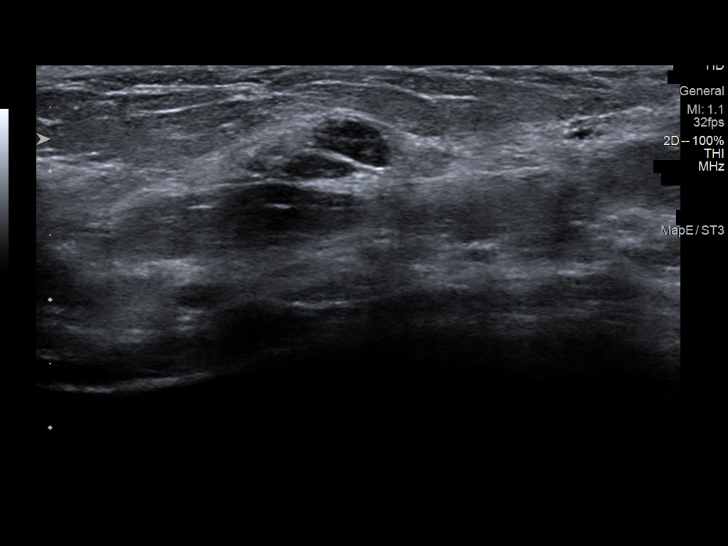
[im 5/22]
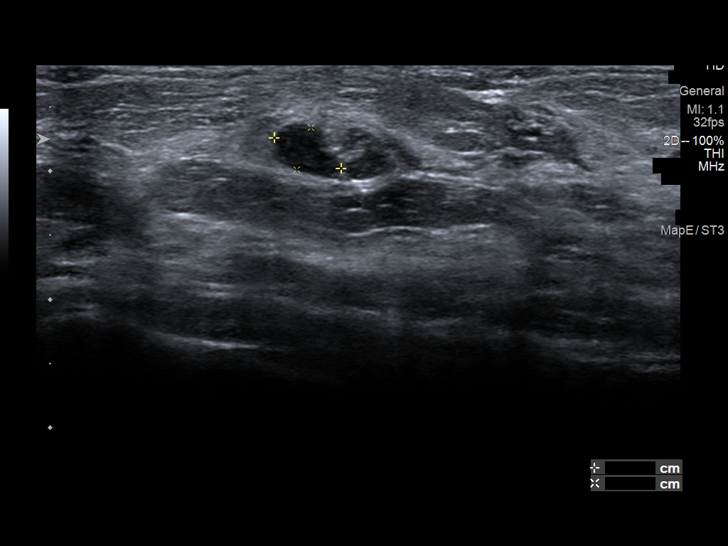
[im 6/22]
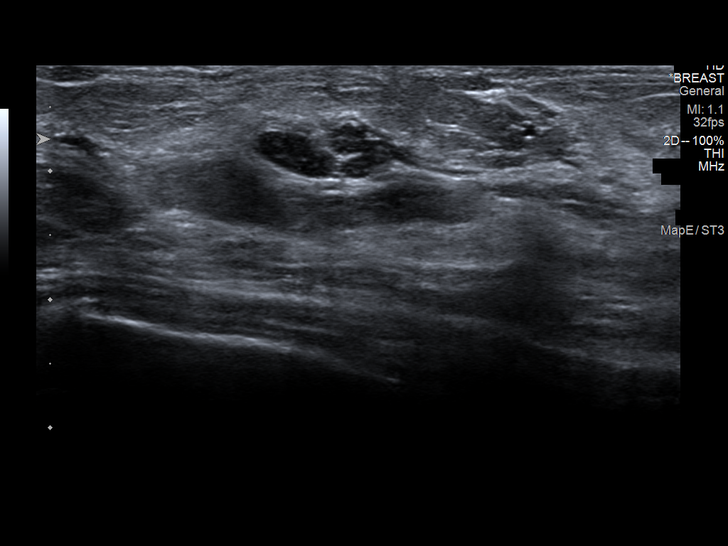
[im 8/22]
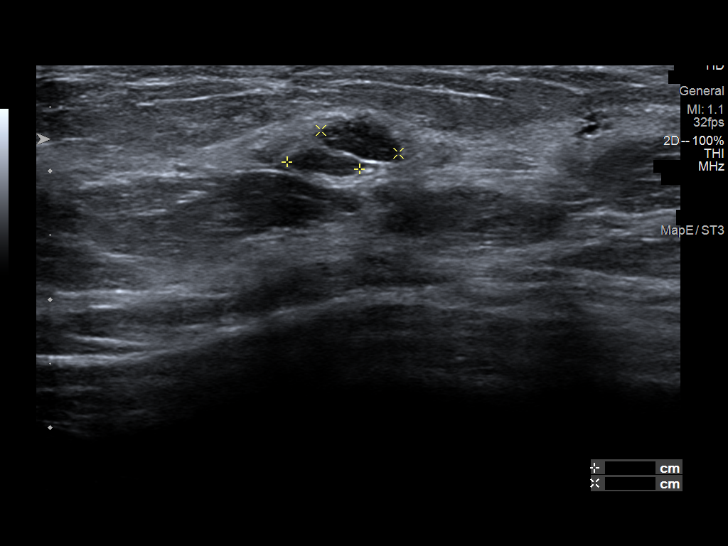
[im 10/22]
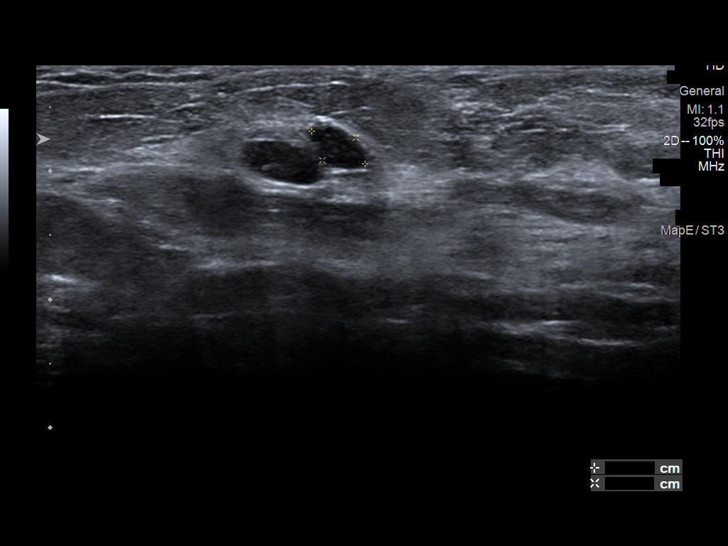
[im 12/22]
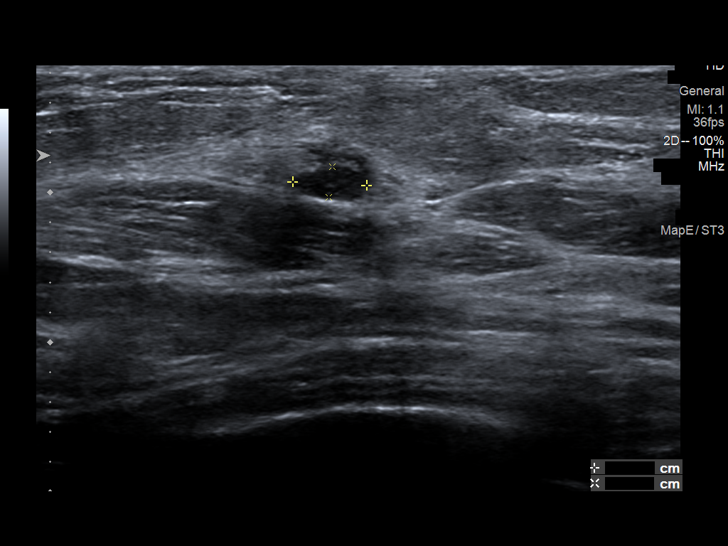
[im 13/22]
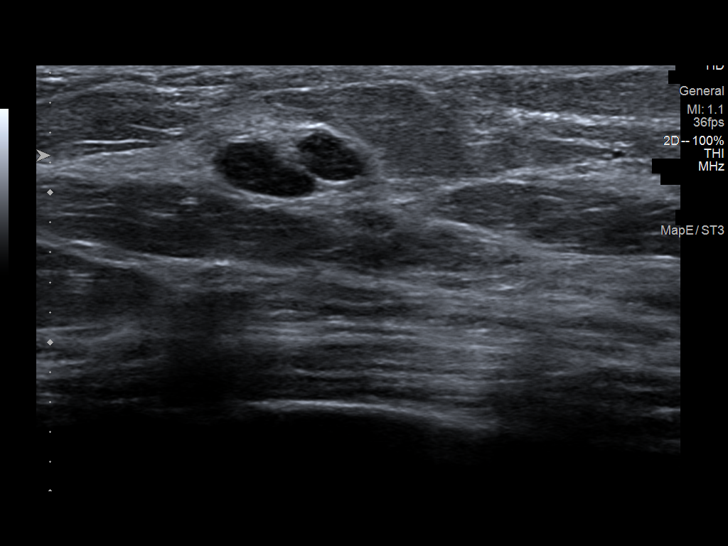
[im 15/22]
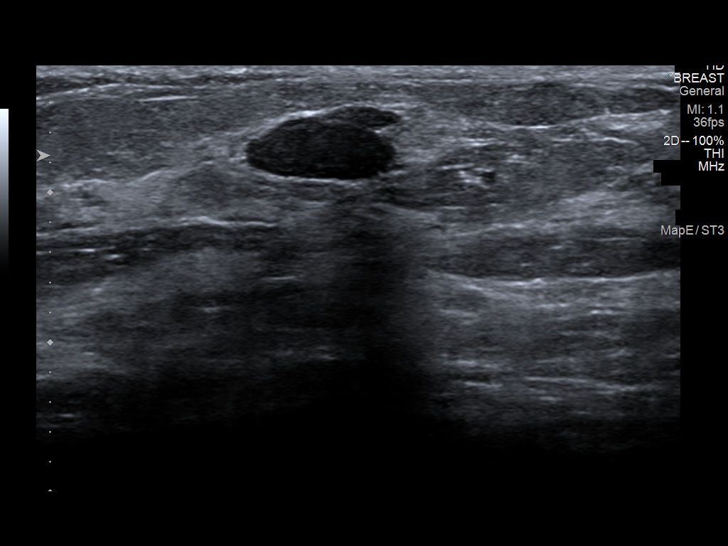
[im 17/22]
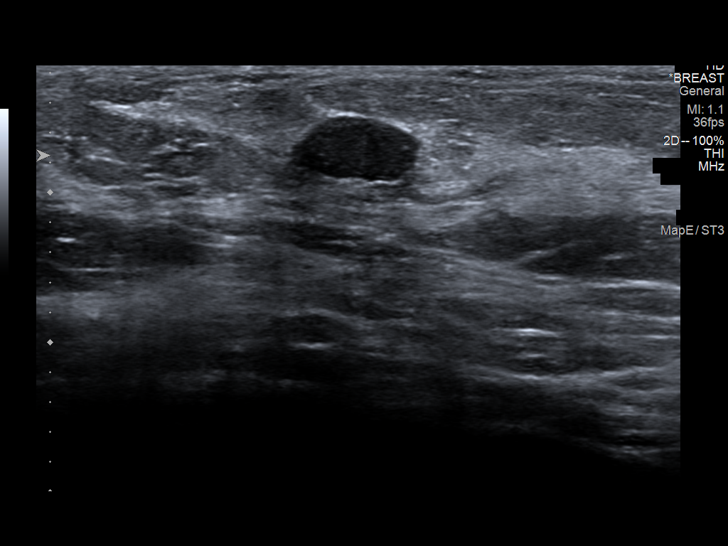
[im 18/22]
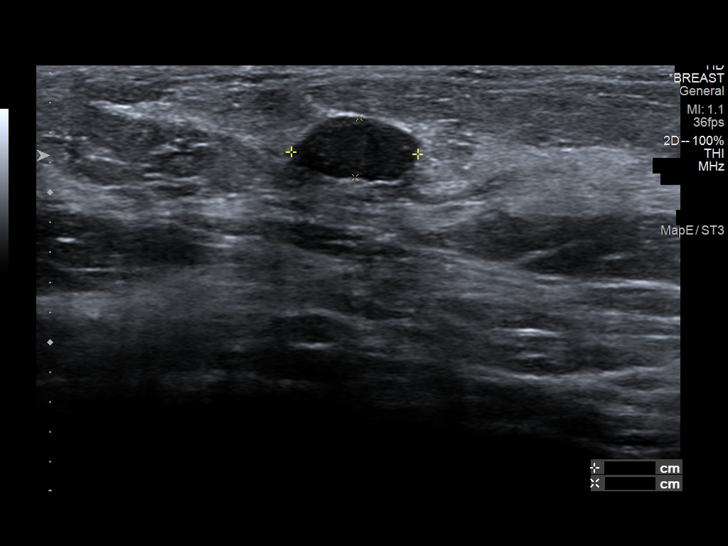
[im 20/22]
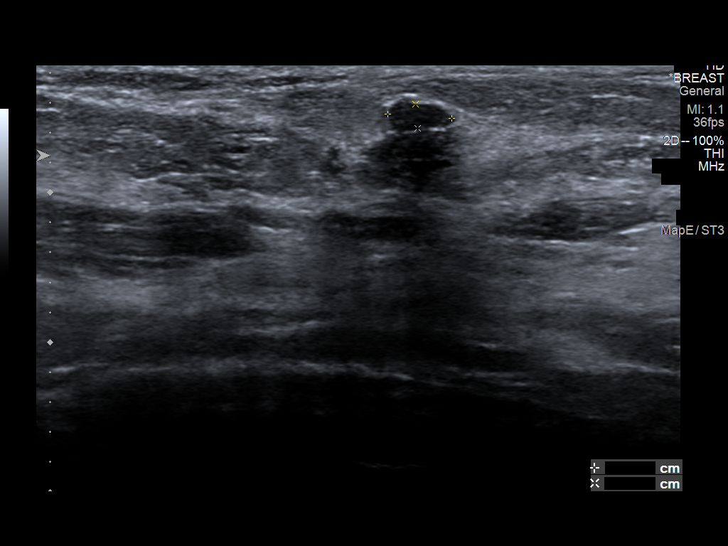
[im 22/22]
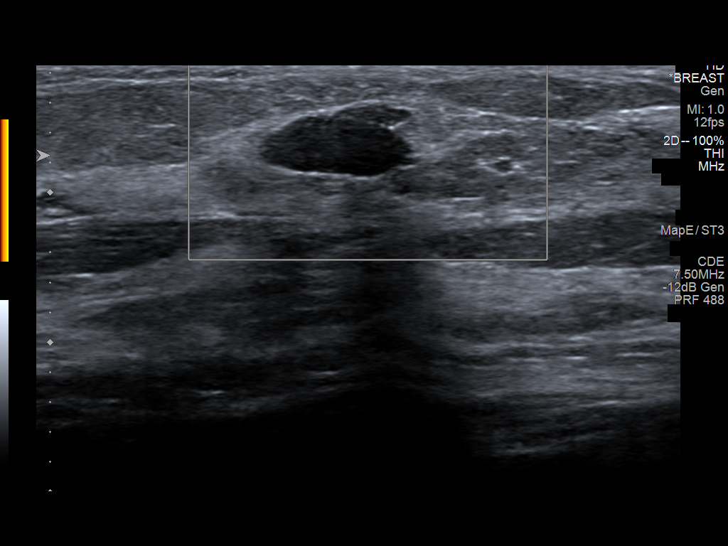

[13 of 22 positions shown; findings below may reference images not displayed]

ACR Breast Density Category c: The breast tissue is heterogeneously
dense, which may obscure small masses.
FINDINGS: Within the superior slightly lateral right breast there is a
persistent small oval mass further evaluated with spot compression
views.

Within the upper inner left breast there are two adjacent oval
circumscribed masses, further evaluated with spot compression views.

Targeted ultrasound is performed, showing a 7 x 6 x 3 mm lobular
hypoechoic mass within the left breast 10 o'clock position 4 cm from
the nipple. There is an adjacent 5 x 3 x 5 mm oval circumscribed
mass within the left breast 10 o'clock position 4 cm from nipple.

Within the left breast 11 o'clock position 3 cm from nipple there is
a 10 x 9 x 4 mm oval circumscribed mass. There is an adjacent 4 x 2
x 5 mm oval circumscribed hypoechoic mass.

No definite sonographic correlate identified within the right breast
for the mammographically identified mass.
IMPRESSION: Probably benign left breast masses, favored to represent
fibroadenomas.

Probably benign sonographically occult right breast mass.

RECOMMENDATION:
Bilateral diagnostic mammography and left breast ultrasound in 6
months to reassess probably benign bilateral breast masses.

I have discussed the findings and recommendations with the patient.
If applicable, a reminder letter will be sent to the patient
regarding the next appointment.

BI-RADS CATEGORY  3: Probably benign.

## 2023-08-12 ENCOUNTER — Telehealth: Payer: Self-pay | Admitting: Diagnostic Neuroimaging

## 2023-08-12 NOTE — Telephone Encounter (Signed)
 Called the patient back. She has noticed that for the month of December there has been increase in her migraines and headaches.  States that it is over 15 and she for sure if using all the nurtec up which is only 8 a month.  She states that she has had extreme difficulty with focusing on task at hand while at work, struggling with remembering things, and her scores have declined at work.  She was out for a whole week due to the migraine she had and nothing has been helping. When she does work, she states that she doesn't have a migraine or headache but the symptoms mentioned are still of concerned. I asked did she notice if this worsened when starting the topiramate  and she did agree to that.  Advised that before I see if we need to work her in, I will ask Dr Margaret what he may recommend as next steps. May need that we need to change her preventative medication.

## 2023-08-12 NOTE — Telephone Encounter (Signed)
 Pt states she has been having trouble communicating and staying focused when she has a migraine. States it's to the point where it is effecting her scores at work and is concerning. Next opening isn't until June and would like to be seen sooner. Requesting call back to discuss

## 2023-08-12 NOTE — Telephone Encounter (Signed)
 Called and scheduled the patient for wed at 3:30 pm

## 2023-08-12 NOTE — Telephone Encounter (Signed)
 Pls setup video visit with me or NP for migraine mgmt. -VRP

## 2023-08-14 ENCOUNTER — Encounter: Payer: Self-pay | Admitting: Diagnostic Neuroimaging

## 2023-08-14 ENCOUNTER — Ambulatory Visit: Payer: 59 | Admitting: Diagnostic Neuroimaging

## 2023-08-14 VITALS — BP 109/66 | HR 70 | Ht 62.0 in | Wt 153.0 lb

## 2023-08-14 DIAGNOSIS — G43109 Migraine with aura, not intractable, without status migrainosus: Secondary | ICD-10-CM

## 2023-08-14 DIAGNOSIS — R519 Headache, unspecified: Secondary | ICD-10-CM

## 2023-08-14 MED ORDER — NURTEC 75 MG PO TBDP: 75.0000 mg | ORAL_TABLET | Freq: Every day | ORAL | 6 refills | Status: AC | PRN

## 2023-08-14 MED ORDER — QULIPTA 60 MG PO TABS: 60.0000 mg | ORAL_TABLET | Freq: Every day | ORAL | 12 refills | Status: AC

## 2023-08-14 NOTE — Progress Notes (Signed)
 [facility]  PATIENT: Tiffany Singleton DOB: Jun 24, 1981  REFERRING CLINICIAN: Jason Leita Repine,* HISTORY FROM: patient  REASON FOR VISIT: follow up   HISTORICAL  CHIEF COMPLAINT:  Chief Complaint  Patient presents with   Follow-up    Patient In room #7 with her husband. Patient states she been having a lot of migraines and short terms memory. Patient states she have been very forgetful and confuse. Patient states she has some issues with her Rx .    HISTORY OF PRESENT ILLNESS:   UPDATE (08/14/23, VRP): Since last visit, avg 8-9 HA per month; now 15 in Dec 2024.  Has tolerated topiramate  50 mg, 2 tablets at bedtime.  Continues to have some mild memory and brain fog issues which have been there for last few years.  UPDATE (04/30/23, VRP): Since last visit, doing better on TPX; except more severe HA in last few weeks. Nurtec helps for breakthrough HA. Caffeine, sugar aggravate HA.   PRIOR HPI (12/26/22, VRP): 43 year old female here for evaluation of headaches.  Has had headaches since age 66 years old.  She describes frontal and bilateral severe throbbing headaches with photosensitivity and nausea.  She modified her diet and lifestyle factors and headaches essentially resolved.  In last 4-5 years headaches have returned.  In last few months headaches have increased.  Now more on the right side and retro-orbital pain, up to once per week.  Headaches can last hours to days at a time.  Triggers include wine, cheese, change in caffeine.  Sometimes she sees visual aura with sparkles in her vision.  Patient has tried Excedrin Migraine, Sudafed with mild relief.  Was tried on Imitrex in the past but this caused a tight feeling in the tongue and mouth with increased heart rate.  Mother passed away few years ago from peritoneal cancer with metastasis to the brain.  Paternal grandmother had a brain aneurysm.   REVIEW OF SYSTEMS: Full 14 system review of systems performed  and negative with exception of: as per HPI.  ALLERGIES: Allergies  Allergen Reactions   Hydrocodone Hives and Itching   Lexapro  [Escitalopram ] Other (See Comments)    headache   Sumatriptan Other (See Comments)    Increased heart rate/ sensation of tongue swelling    HOME MEDICATIONS: Outpatient Medications Prior to Visit  Medication Sig Dispense Refill   etonogestrel (NEXPLANON) 68 MG IMPL implant Nexplanon 68 mg subdermal implant  Inject 1 implant every day by subcutaneous route.     topiramate  (TOPAMAX ) 50 MG tablet Take 1 tablet (50 mg total) by mouth 2 (two) times daily. 60 tablet 12   Rimegepant Sulfate (NURTEC) 75 MG TBDP Take 1 tablet (75 mg total) by mouth daily as needed. 8 tablet 6   No facility-administered medications prior to visit.    PAST MEDICAL HISTORY: Past Medical History:  Diagnosis Date   Family history of breast cancer    Family history of lung cancer    Family history of ovarian cancer    Family history of prostate cancer     PAST SURGICAL HISTORY: Past Surgical History:  Procedure Laterality Date   LYMPH GLAND EXCISION     WISDOM TOOTH EXTRACTION      FAMILY HISTORY: Family History  Problem Relation Age of Onset   Lung cancer Mother 27   Ovarian cancer Mother 28   Brain cancer Mother    Hypertension Father    Prostate cancer Father    Breast cancer Paternal Grandmother  dx. in her 9s   Ovarian cancer Paternal Grandmother        dx. in her late 7s    SOCIAL HISTORY: Social History   Socioeconomic History   Marital status: Single    Spouse name: Not on file   Number of children: Not on file   Years of education: Not on file   Highest education level: Bachelor's degree (e.g., BA, AB, BS)  Occupational History   Not on file  Tobacco Use   Smoking status: Never   Smokeless tobacco: Never  Vaping Use   Vaping status: Never Used  Substance and Sexual Activity   Alcohol use: Yes    Comment: occ   Drug use: No   Sexual  activity: Not on file  Other Topics Concern   Not on file  Social History Narrative   Not on file   Social Drivers of Health   Financial Resource Strain: Medium Risk (12/13/2022)   Overall Financial Resource Strain (CARDIA)    Difficulty of Paying Living Expenses: Somewhat hard  Food Insecurity: Food Insecurity Present (12/13/2022)   Hunger Vital Sign    Worried About Running Out of Food in the Last Year: Sometimes true    Ran Out of Food in the Last Year: Sometimes true  Transportation Needs: No Transportation Needs (12/13/2022)   PRAPARE - Administrator, Civil Service (Medical): No    Lack of Transportation (Non-Medical): No  Physical Activity: Sufficiently Active (12/13/2022)   Exercise Vital Sign    Days of Exercise per Week: 5 days    Minutes of Exercise per Session: 30 min  Stress: No Stress Concern Present (12/13/2022)   [employer] of Occupational Health - Occupational Stress Questionnaire    Feeling of Stress : Only a little  Social Connections: Moderately Isolated (12/13/2022)   Social Connection and Isolation Panel [NHANES]    Frequency of Communication with Friends and Family: More than three times a week    Frequency of Social Gatherings with Friends and Family: Once a week    Attends Religious Services: 1 to 4 times per year    Active Member of [employer] or Organizations: No    Attends Engineer, Structural: Not on file    Marital Status: Divorced  Intimate Partner Violence: Low Risk  (09/25/2020)   Received from [employer]   Intimate Partner Violence    Insults You: Not on file    Threatens You: Not on file    Screams at [place]: Not on file    Physically Hurt: Not on file    Intimate Partner Violence Score: Not on file     PHYSICAL EXAM  GENERAL EXAM/CONSTITUTIONAL: Vitals:  Vitals:   08/14/23 1534  BP: 109/66  Pulse: 70  Weight: 153 lb (69.4 kg)  Height: 5' 2 (1.575 m)    Body mass index is 27.98 kg/m. Wt Readings  from Last 3 Encounters:  08/14/23 153 lb (69.4 kg)  12/26/22 162 lb 9.6 oz (73.8 kg)  12/14/22 163 lb (73.9 kg)   Patient is in no distress; well developed, nourished and groomed; neck is supple  CARDIOVASCULAR: Examination of carotid arteries is normal; no carotid bruits Regular rate and rhythm, no murmurs Examination of peripheral vascular system by observation and palpation is normal  EYES: Ophthalmoscopic exam of optic discs and posterior segments is normal; no papilledema or hemorrhages No results found.  MUSCULOSKELETAL: Gait, strength, tone, movements noted in Neurologic exam below  NEUROLOGIC: MENTAL  STATUS:      No data to display         awake, alert, oriented to person, place and time recent and remote memory intact normal attention and concentration language fluent, comprehension intact, naming intact fund of knowledge appropriate  CRANIAL NERVE:  2nd - no papilledema on fundoscopic exam 2nd, 3rd, 4th, 6th - pupils equal and reactive to light, visual fields full to confrontation, extraocular muscles intact, no nystagmus 5th - facial sensation symmetric 7th - facial strength symmetric 8th - hearing intact 9th - palate elevates symmetrically, uvula midline 11th - shoulder shrug symmetric 12th - tongue protrusion midline  MOTOR:  normal bulk and tone, full strength in the BUE, BLE  SENSORY:  normal and symmetric to light touch, temperature, vibration  COORDINATION:  finger-nose-finger, fine finger movements normal  REFLEXES:  deep tendon reflexes present and symmetric  GAIT/STATION:  narrow based gait     DIAGNOSTIC DATA (LABS, IMAGING, TESTING) - I reviewed patient records, labs, notes, testing and imaging myself where available.  Lab Results  Component Value Date   WBC 8.5 10/19/2022   HGB 13.7 10/19/2022   HCT 41.0 10/19/2022   MCV 90.7 10/19/2022   PLT 153.0 10/19/2022      Component Value Date/Time   NA 138 10/19/2022 1138   K  4.3 10/19/2022 1138   CL 102 10/19/2022 1138   CO2 29 10/19/2022 1138   GLUCOSE 86 10/19/2022 1138   BUN 7 10/19/2022 1138   CREATININE 0.81 10/19/2022 1138   CALCIUM 9.5 10/19/2022 1138   PROT 7.1 10/19/2022 1138   ALBUMIN 4.1 10/19/2022 1138   AST 15 10/19/2022 1138   ALT 14 10/19/2022 1138   ALKPHOS 60 10/19/2022 1138   BILITOT 0.5 10/19/2022 1138   GFRNONAA >60 12/01/2009 1131   GFRAA  12/01/2009 1131    >60        The eGFR has been calculated using the MDRD equation. This calculation has not been validated in all clinical situations. eGFR's persistently <60 mL/min signify possible Chronic Kidney Disease.   Lab Results  Component Value Date   CHOL 169 10/19/2022   HDL 55.80 10/19/2022   LDLCALC 105 (H) 10/19/2022   TRIG 44.0 10/19/2022   CHOLHDL 3 10/19/2022   Lab Results  Component Value Date   HGBA1C 5.3 10/19/2022   Lab Results  Component Value Date   VITAMINB12 257 10/19/2022   Lab Results  Component Value Date   TSH 0.77 10/19/2022       ASSESSMENT AND PLAN  43 y.o. year old female here with:  Meds tried: ibuprofen, tylenol , Excedrin migraine, sumatriptan (palpitations)  Dx:  1. Migraine with aura and without status migrainosus, not intractable      PLAN:  BRAIN FOG / MEMORY LOSS, INCREASING HA - check MRI brain (rule out mass, infx, inflamm)  MIGRAINE PREVENTION (meds tried: topiramate ; avg 8 days HA per month) -Eat and sleep on a regular schedule -Exercise several times per week - stop topiramate   - start qulipta  60mg  daily   MIGRAINE RESCUE  - ibuprofen, tylenol  as needed - continue rimegepant (Nurtec) 75mg  as needed for breakthrough headache; max 8 per month  Meds ordered this encounter  Medications   Atogepant  (QULIPTA ) 60 MG TABS    Sig: Take 1 tablet (60 mg total) by mouth daily.    Dispense:  30 tablet    Refill:  12   Rimegepant Sulfate (NURTEC) 75 MG TBDP    Sig: Take 1 tablet (  75 mg total) by mouth daily as needed.     Dispense:  8 tablet    Refill:  6   Orders Placed This Encounter  Procedures   MR BRAIN W WO CONTRAST   Return in about 6 months (around 02/11/2024) for MyChart visit (15 min).     EDUARD FABIENE HANLON, MD 08/14/2023, 3:51 PM Certified in Neurology, Neurophysiology and Neuroimaging  [facility] [address], [address] [place], [place] [address] [phone]

## 2023-08-19 ENCOUNTER — Telehealth: Payer: Self-pay | Admitting: Diagnostic Neuroimaging

## 2023-08-19 NOTE — Telephone Encounter (Signed)
 Tasia Catchings: [id number] exp. 08/19/23-10/03/23, Yetta Numbers: [id number] exp. 08/19/23-09/17/23 sent to [facility], they are what is in network with the [facility]. [phone]

## 2023-08-22 ENCOUNTER — Encounter: Payer: Self-pay | Admitting: *Deleted

## 2023-08-26 ENCOUNTER — Telehealth: Payer: Self-pay

## 2023-08-26 NOTE — Telephone Encounter (Signed)
Please obtain a prior authorization for Qulipta 60 mg tablet. Thank you.

## 2023-08-27 ENCOUNTER — Other Ambulatory Visit (HOSPITAL_COMMUNITY): Payer: Self-pay

## 2023-08-27 ENCOUNTER — Telehealth: Payer: Self-pay

## 2023-08-27 NOTE — Telephone Encounter (Signed)
Pharmacy Patient Advocate Encounter  Received notification from [facility] that Prior Authorization for Qulipta 60MG  tablets has been APPROVED from 08/27/2023 to 11/25/2023. Ran test claim, Copay is $0. This test claim was processed through [facility]- copay amounts may vary at other pharmacies due to pharmacy/plan contracts, or as the patient moves through the different stages of their insurance plan.   PA #/Case ID/Reference #: PA Case ID #: [id number]

## 2023-08-27 NOTE — Telephone Encounter (Signed)
Pt informed via my chart. See encounter on 08/26/23.

## 2023-08-27 NOTE — Telephone Encounter (Signed)
Pharmacy Patient Advocate Encounter   Received notification from Physician's Office that prior authorization for Qulipta 60MG  tablets is required/requested.   Insurance verification completed.   The patient is insured through [facility] .   Per test claim: PA required; PA submitted to above mentioned insurance via CoverMyMeds Key/confirmation #/EOC [id number] Status is pending

## 2023-08-27 NOTE — Telephone Encounter (Signed)
Pharmacy Patient Advocate Encounter   Received notification from CoverMyMeds that prior authorization for Nurtec 75MG  dispersible tablets is required/requested.   Insurance verification completed.   The patient is insured through [facility] .   Per test claim: PA required; PA started via CoverMyMeds. KEY [id number] . Waiting for clinical questions to populate.

## 2023-08-28 ENCOUNTER — Other Ambulatory Visit: Payer: Self-pay | Admitting: Diagnostic Neuroimaging

## 2023-08-28 ENCOUNTER — Ambulatory Visit (HOSPITAL_BASED_OUTPATIENT_CLINIC_OR_DEPARTMENT_OTHER)
Admission: RE | Admit: 2023-08-28 | Discharge: 2023-08-28 | Disposition: A | Discharge status: Home / Self Care | Payer: 59 | Source: Ambulatory Visit | Attending: Diagnostic Neuroimaging | Admitting: Diagnostic Neuroimaging

## 2023-08-28 ENCOUNTER — Other Ambulatory Visit (HOSPITAL_COMMUNITY): Payer: Self-pay

## 2023-08-28 DIAGNOSIS — R519 Headache, unspecified: Secondary | ICD-10-CM

## 2023-08-28 DIAGNOSIS — G43109 Migraine with aura, not intractable, without status migrainosus: Secondary | ICD-10-CM

## 2023-08-28 NOTE — Telephone Encounter (Signed)
Pt informed via my chart. See encounter on 08/26/23.

## 2023-08-28 NOTE — Telephone Encounter (Signed)
Pharmacy Patient Advocate Encounter  Received notification from [facility] that Prior Authorization for Nurtec 75MG  dispersible tablets has been APPROVED from 08/28/23 to 08/27/24. Ran test claim, Copay is $0. This test claim was processed through [facility]- copay amounts may vary at other pharmacies due to pharmacy/plan contracts, or as the patient moves through the different stages of their insurance plan.   PA #/Case ID/Reference #: PA Case ID #: [id number]

## 2023-08-29 ENCOUNTER — Other Ambulatory Visit: Payer: Self-pay | Admitting: Obstetrics and Gynecology

## 2023-08-29 DIAGNOSIS — N6320 Unspecified lump in the left breast, unspecified quadrant: Secondary | ICD-10-CM

## 2023-08-30 NOTE — Progress Notes (Signed)
Results are good, no major findings. Continue current plan. -VRP

## 2023-09-11 NOTE — Telephone Encounter (Signed)
 Form completed and placed in medical records to be completed.

## 2023-09-13 ENCOUNTER — Telehealth: Payer: Self-pay | Admitting: *Deleted

## 2023-09-13 NOTE — Telephone Encounter (Signed)
 Pt form was faxed on 09/11/2023

## 2023-09-26 ENCOUNTER — Other Ambulatory Visit: Payer: 59

## 2024-02-12 ENCOUNTER — Telehealth: Payer: Self-pay | Admitting: Diagnostic Neuroimaging

## 2024-02-12 NOTE — Telephone Encounter (Signed)
 LVM and sent mychart msg informing pt of need to reschedule 02/17/24 appt - MD out  If patient calls back you can offer a video visit with Dr. Margaret the week of 02/19/24

## 2024-02-17 ENCOUNTER — Ambulatory Visit: Payer: BC Managed Care – PPO | Admitting: Diagnostic Neuroimaging

## 2024-04-23 ENCOUNTER — Telehealth: Payer: Self-pay

## 2024-04-23 ENCOUNTER — Other Ambulatory Visit (HOSPITAL_COMMUNITY): Payer: Self-pay

## 2024-04-23 NOTE — Telephone Encounter (Signed)
 Pharmacy Patient Advocate Encounter   Received notification from CoverMyMeds that prior authorization for NURTEC is required/requested.   Insurance verification completed.   The patient is insured through [facility] MEDICAID .   Per test claim: PA required; PA submitted to above mentioned insurance via Latent Key/confirmation #/EOC [id number] Status is pending

## 2024-04-23 NOTE — Telephone Encounter (Signed)
 Pharmacy Patient Advocate Encounter  Received notification from [facility] MEDICAID that Prior Authorization for Nurtec has been APPROVED from 04/23/2024 to 04/23/2025   PA #/Case ID/Reference #: [id number]

## 2024-04-30 ENCOUNTER — Encounter

## 2024-04-30 DIAGNOSIS — Z1231 Encounter for screening mammogram for malignant neoplasm of breast: Secondary | ICD-10-CM

## 2024-05-06 ENCOUNTER — Other Ambulatory Visit: Payer: Self-pay | Admitting: Obstetrics and Gynecology

## 2024-05-06 ENCOUNTER — Ambulatory Visit
Admission: RE | Admit: 2024-05-06 | Discharge: 2024-05-06 | Disposition: A | Source: Ambulatory Visit | Attending: Obstetrics and Gynecology | Admitting: Obstetrics and Gynecology

## 2024-05-06 ENCOUNTER — Ambulatory Visit
Admission: RE | Admit: 2024-05-06 | Discharge: 2024-05-06 | Disposition: A | Discharge status: Home / Self Care | Source: Ambulatory Visit | Attending: Obstetrics and Gynecology | Admitting: Obstetrics and Gynecology

## 2024-05-06 DIAGNOSIS — N6320 Unspecified lump in the left breast, unspecified quadrant: Secondary | ICD-10-CM

## 2024-05-06 DIAGNOSIS — N63 Unspecified lump in unspecified breast: Secondary | ICD-10-CM

## 2024-08-28 ENCOUNTER — Other Ambulatory Visit (HOSPITAL_COMMUNITY): Payer: Self-pay

## 2024-08-31 ENCOUNTER — Other Ambulatory Visit (HOSPITAL_COMMUNITY): Payer: Self-pay
